# Patient Record
Sex: Female | Born: 1995 | Race: Black or African American | Hispanic: No | Marital: Single | Smoking: Never smoker
Health system: Southern US, Community
[De-identification: ages and names within clinical notes are randomized; demographics above are authoritative.]

## PROBLEM LIST (undated history)

## (undated) DIAGNOSIS — F988 Other specified behavioral and emotional disorders with onset usually occurring in childhood and adolescence: Secondary | ICD-10-CM

## (undated) DIAGNOSIS — D649 Anemia, unspecified: Secondary | ICD-10-CM

---

## 2019-07-31 ENCOUNTER — Encounter (HOSPITAL_COMMUNITY): Payer: Self-pay

## 2019-07-31 ENCOUNTER — Other Ambulatory Visit: Payer: Self-pay

## 2019-07-31 ENCOUNTER — Ambulatory Visit (HOSPITAL_COMMUNITY)
Admission: EM | Admit: 2019-07-31 | Discharge: 2019-07-31 | Disposition: A | Discharge status: Home / Self Care | Payer: PRIVATE HEALTH INSURANCE

## 2019-07-31 DIAGNOSIS — S0990XA Unspecified injury of head, initial encounter: Secondary | ICD-10-CM

## 2019-07-31 NOTE — ED Triage Notes (Signed)
Pt states she tripped and fell yesterday and hit her frontal head onto corner of wall. Pt denies LOC. PT states she saw a "bright flash" when she hit her head. Pt woke up today dizzy and nauseated. Pt states she spit up. Pt denies blood thinners. Pt walked well to exam room. Pt is A&Ox4. Pt is able to move all extremities.

## 2019-07-31 NOTE — ED Provider Notes (Signed)
[place]    CSN: [id number] Arrival date & time: 07/31/19  1217      History   Chief Complaint Chief Complaint  Patient presents with  . Fall    HPI Melody Colville is a 24 y.o. female.   Patient is a 24 year old female presents today for fall and head injury.  Reporting yesterday she fell hit her frontal head onto the corner of a wall.  Small abrasion swelling.  Denies any loss of consciousness after the head injury.  Had mild headache afterwards and went to bed.  Woke up mildly dizzy, nauseous was still having about a 4 out of 10 headache.  Denies being on any blood thinners.  Denies any vision changes.  She has not taken any medication for symptoms.  Denies any numbness, tingling or weakness in extremities.  She has had some mild photophobia.  ROS per HPI      History reviewed. No pertinent past medical history.  There are no problems to display for this patient.   History reviewed. No pertinent surgical history.  OB History   No obstetric history on file.      Home Medications    Prior to Admission medications   Not on File    Family History Family History  Problem Relation Age of Onset  . Asthma Mother   . Cancer Father     Social History Social History   Tobacco Use  . Smoking status: Never Smoker  . Smokeless tobacco: Never Used  Substance Use Topics  . Alcohol use: Yes    Comment: occ  . Drug use: Never     Allergies   Patient has no known allergies.   Review of Systems Review of Systems   Physical Exam Triage Vital Signs ED Triage Vitals [07/31/19 1252]  Enc Vitals Group     BP 122/70     Pulse Rate 91     Resp 16     Temp 98.5 F (36.9 C)     Temp Source Oral     SpO2 98 %     Weight 155 lb (70.3 kg)     Height 5\' 5"  (1.651 m)     Head Circumference      Peak Flow      Pain Score 4     Pain Loc      Pain Edu?      Excl. in [place]?    No data found.  Updated Vital Signs BP 122/70   Pulse 91   Temp 98.5  F (36.9 C) (Oral)   Resp 16   Ht 5\' 5"  (1.651 m)   Wt 155 lb (70.3 kg)   SpO2 98%   BMI 25.79 kg/m   Visual Acuity Right Eye Distance:   Left Eye Distance:   Bilateral Distance:    Right Eye Near:   Left Eye Near:    Bilateral Near:     Physical Exam Vitals and nursing note reviewed.  Constitutional:      General: She is not in acute distress.    Appearance: Normal appearance. She is not ill-appearing, toxic-appearing or diaphoretic.  HENT:     Head: Normocephalic.     Nose: Nose normal.  Eyes:     Extraocular Movements: Extraocular movements intact.     Conjunctiva/sclera: Conjunctivae normal.     Pupils: Pupils are equal, round, and reactive to light.  Pulmonary:     Effort: Pulmonary effort is normal.  Musculoskeletal:  General: Normal range of motion.     Cervical back: Normal range of motion.  Skin:    General: Skin is warm and dry.     Findings: No rash.  Neurological:     General: No focal deficit present.     Mental Status: She is alert.     Cranial Nerves: No cranial nerve deficit.     Sensory: No sensory deficit.     Motor: No weakness.     Coordination: Coordination normal.     Gait: Gait normal.     Comments: Cranial nerves grossly intact. Strength 5 out of 5 in all extremities Normal speech and gait   Psychiatric:        Mood and Affect: Mood normal.      UC Treatments / Results  Labs (all labs ordered are listed, but only abnormal results are displayed) Labs Reviewed - No data to display  EKG   Radiology No results found.  Procedures Procedures (including critical care time)  Medications Ordered in UC Medications - No data to display  Initial Impression / Assessment and Plan / UC Course  I have reviewed the triage vital signs and the nursing notes.  Pertinent labs & imaging results that were available during my care of the patient were reviewed by me and considered in my medical decision making (see chart for  details).     Head injury-neuro exam normal.  Patient does have mild forehead swelling with mild abrasion centered. She may have mild concussion.  We will have her decrease screen time. She is not currently having any nausea. Tylenol as needed for headache and rest Work note given to rest ER and return precautions given Final Clinical Impressions(s) / UC Diagnoses   Final diagnoses:  Injury of head, initial encounter     Discharge Instructions     You may have a mild concussion Decrease screen time Tylenol for headache.  Rest  Follow up as needed for continued or worsening symptoms     ED Prescriptions    None     PDMP not reviewed this encounter.   Janace Aris, NP 07/31/19 1329

## 2019-07-31 NOTE — Discharge Instructions (Signed)
You may have a mild concussion Decrease screen time Tylenol for headache.  Rest  Follow up as needed for continued or worsening symptoms

## 2020-01-20 ENCOUNTER — Other Ambulatory Visit: Payer: Self-pay

## 2020-01-20 ENCOUNTER — Emergency Department (HOSPITAL_BASED_OUTPATIENT_CLINIC_OR_DEPARTMENT_OTHER): Payer: No Typology Code available for payment source

## 2020-01-20 ENCOUNTER — Encounter (HOSPITAL_BASED_OUTPATIENT_CLINIC_OR_DEPARTMENT_OTHER): Payer: Self-pay

## 2020-01-20 ENCOUNTER — Emergency Department (HOSPITAL_BASED_OUTPATIENT_CLINIC_OR_DEPARTMENT_OTHER)
Admission: EM | Admit: 2020-01-20 | Discharge: 2020-01-20 | Disposition: A | Discharge status: Home / Self Care | Payer: No Typology Code available for payment source | Attending: Emergency Medicine | Admitting: Emergency Medicine

## 2020-01-20 DIAGNOSIS — S0990XA Unspecified injury of head, initial encounter: Secondary | ICD-10-CM | POA: Diagnosis present

## 2020-01-20 DIAGNOSIS — Y939 Activity, unspecified: Secondary | ICD-10-CM | POA: Diagnosis not present

## 2020-01-20 DIAGNOSIS — S060X0A Concussion without loss of consciousness, initial encounter: Secondary | ICD-10-CM | POA: Diagnosis not present

## 2020-01-20 DIAGNOSIS — Z79899 Other long term (current) drug therapy: Secondary | ICD-10-CM | POA: Diagnosis not present

## 2020-01-20 DIAGNOSIS — Y999 Unspecified external cause status: Secondary | ICD-10-CM | POA: Diagnosis not present

## 2020-01-20 DIAGNOSIS — Y92410 Unspecified street and highway as the place of occurrence of the external cause: Secondary | ICD-10-CM | POA: Insufficient documentation

## 2020-01-20 DIAGNOSIS — R519 Headache, unspecified: Secondary | ICD-10-CM

## 2020-01-20 MED ORDER — ONDANSETRON 4 MG PO TBDP
4.0000 mg | ORAL_TABLET | Freq: Once | ORAL | Status: AC
  Administered 2020-01-20: 4 mg via ORAL
  Filled 2020-01-20: qty 1

## 2020-01-20 MED ORDER — ACETAMINOPHEN 500 MG PO TABS
1000.0000 mg | ORAL_TABLET | Freq: Once | ORAL | Status: AC
  Administered 2020-01-20: 1000 mg via ORAL
  Filled 2020-01-20: qty 2

## 2020-01-20 MED ORDER — ONDANSETRON 4 MG PO TBDP: 4.0000 mg | ORAL_TABLET | Freq: Three times a day (TID) | ORAL | 0 refills | Status: DC | PRN
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
## 2020-01-20 NOTE — Discharge Instructions (Addendum)
You can take 600 mg of ibuprofen every 6 hours, you can take 1000 mg of Tylenol every 6 hours, you can alternate these every 3 or you can take them together.  
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
## 2020-06-08 ENCOUNTER — Other Ambulatory Visit: Payer: Self-pay

## 2020-06-08 DIAGNOSIS — K645 Perianal venous thrombosis: Secondary | ICD-10-CM | POA: Diagnosis not present

## 2020-06-08 DIAGNOSIS — R040 Epistaxis: Secondary | ICD-10-CM | POA: Diagnosis not present

## 2020-06-09 ENCOUNTER — Emergency Department (HOSPITAL_BASED_OUTPATIENT_CLINIC_OR_DEPARTMENT_OTHER)
Admission: EM | Admit: 2020-06-09 | Discharge: 2020-06-09 | Disposition: A | Discharge status: Home / Self Care | Payer: Medicaid - Out of State | Attending: Emergency Medicine | Admitting: Emergency Medicine

## 2020-06-09 ENCOUNTER — Other Ambulatory Visit: Payer: Self-pay

## 2020-06-09 ENCOUNTER — Encounter (HOSPITAL_BASED_OUTPATIENT_CLINIC_OR_DEPARTMENT_OTHER): Payer: Self-pay | Admitting: *Deleted

## 2020-06-09 DIAGNOSIS — R040 Epistaxis: Secondary | ICD-10-CM

## 2020-06-09 DIAGNOSIS — K645 Perianal venous thrombosis: Secondary | ICD-10-CM

## 2020-06-09 HISTORY — DX: Other specified behavioral and emotional disorders with onset usually occurring in childhood and adolescence: F98.8

## 2020-06-09 MED ORDER — OXYMETAZOLINE HCL 0.05 % NA SOLN
2.0000 | NASAL | Status: DC | PRN
  Administered 2020-06-09: 2 via NASAL
  Filled 2020-06-09: qty 30

## 2020-06-09 MED ORDER — LIDOCAINE 5 % EX CREA: TOPICAL_CREAM | CUTANEOUS | Status: DC

## 2020-06-09 NOTE — ED Triage Notes (Signed)
C/o nosebleed x 2 days and rectal bleeding x 1 day reports " mass at rectum"

## 2020-06-09 NOTE — ED Provider Notes (Signed)
[facility] Provider Note: Lowella Dell, MD, FACEP  CSN: [id number] MRN: [id number] ARRIVAL: 06/08/20 at 2352 ROOM: MH10/[facility]10   CHIEF COMPLAINT  Epistaxis and Rectal Bleeding   HISTORY OF PRESENT ILLNESS  06/09/20 1:06 AM Teresa Middleton is a 25 y.o. female who had the sudden onset of bilateral epistaxis yesterday.  Bleeding was moderate.  There was no trauma or inciting event.  The bleeding lasted only over 20 minutes but resolved on its own with pressure.  She is not having any bleeding now.  She never used any medications.  She also noticed a painful mass in her rectum yesterday.  She did not have any abnormally hard or strenuous bowel movements.  There is some bleeding associated with this mass.  She rates associated pain is a 6 out of 10, worse with defecation.   Past Medical History:  Diagnosis Date  . ADD (attention deficit disorder)     History reviewed. No pertinent surgical history.  Family History  Problem Relation Age of Onset  . Asthma Mother   . Cancer Father     Social History   Tobacco Use  . Smoking status: Never Smoker  . Smokeless tobacco: Never Used  Vaping Use  . Vaping Use: Never used  Substance Use Topics  . Alcohol use: Yes    Comment: occ  . Drug use: Never    Prior to Admission medications   Medication Sig Start Date End Date Taking? Authorizing Provider  Lidocaine 5 % CREA Apply to thrombosed hemorrhoid as needed for pain. 06/09/20  Yes Neave Lenger, MD  amphetamine-dextroamphetamine (ADDERALL) 20 MG tablet Take 20 mg by mouth daily. 05/25/20   [provider]  ondansetron (ZOFRAN ODT) 4 MG disintegrating tablet Take 1 tablet (4 mg total) by mouth every 8 (eight) hours as needed for up to 10 doses for nausea or vomiting. 01/20/20   Sabino Donovan, MD    Allergies Patient has no known allergies.   REVIEW OF SYSTEMS  Negative except as noted here or in the History of Present Illness.   PHYSICAL EXAMINATION  Initial  Vital Signs Blood pressure 112/75, pulse 79, temperature 98.1 F (36.7 C), temperature source Oral, resp. rate 18, height 5\' 5"  (1.651 m), weight 63.5 kg, last menstrual period 06/08/2020, SpO2 98 %.  Examination General: Well-developed, well-nourished female in no acute distress; appearance consistent with age of record HENT: normocephalic; atraumatic; slight amount of dried blood in nares; pallor of turbinates Eyes: pupils equal, round and reactive to light; extraocular muscles intact Neck: supple Heart: regular rate and rhythm Lungs: clear to auscultation bilaterally Abdomen: soft; nondistended; nontender; bowel sounds present Rectal: Tender, thrombosed hemorrhoid at 3:00 in the lithotomy position Extremities: No deformity; full range of motion; pulses normal Neurologic: Awake, alert and oriented; motor function intact in all extremities and symmetric; no facial droop Skin: Warm and dry Psychiatric: Normal mood and affect   RESULTS  Summary of this visit's results, reviewed and interpreted by myself:   EKG Interpretation  Date/Time:    Ventricular Rate:    PR Interval:    QRS Duration:   QT Interval:    QTC Calculation:   R Axis:     Text Interpretation:        Laboratory Studies: No results found for this or any previous visit (from the past 24 hour(s)). Imaging Studies: No results found.  ED COURSE and MDM  Nursing notes, initial and subsequent vitals signs, including pulse oximetry, reviewed and interpreted  by myself.  Vitals:   06/09/20 0003 06/09/20 0004  BP:  112/75  Pulse:  79  Resp:  18  Temp:  98.1 F (36.7 C)  TempSrc:  Oral  SpO2:  98%  Weight: 63.5 kg   Height: 5\' 5"  (1.651 m)    Medications  oxymetazoline (AFRIN) 0.05 % nasal spray 2 spray (has no administration in time range)    We will provide Afrin for treatment of epistaxis should it recur.  Patient declined surgical treatment of thrombosed hemorrhoid after discussion of risks and  benefits.  She was advised she may return at any time should she change her mind regarding treatment.  PROCEDURES  Procedures   ED DIAGNOSES     ICD-10-CM   1. Epistaxis  R04.0   2. Thrombosed external hemorrhoid  K64.5        Kelcey Wickstrom, , MD 06/09/20 [phone]

## 2020-06-13 ENCOUNTER — Observation Stay (HOSPITAL_COMMUNITY)
Admission: EM | Admit: 2020-06-13 | Discharge: 2020-06-14 | Disposition: A | Discharge status: Home / Self Care | Payer: Medicaid - Out of State | Attending: Family Medicine | Admitting: Family Medicine

## 2020-06-13 ENCOUNTER — Encounter (HOSPITAL_COMMUNITY): Payer: Self-pay | Admitting: Emergency Medicine

## 2020-06-13 ENCOUNTER — Other Ambulatory Visit: Payer: Self-pay

## 2020-06-13 DIAGNOSIS — D649 Anemia, unspecified: Secondary | ICD-10-CM

## 2020-06-13 DIAGNOSIS — D509 Iron deficiency anemia, unspecified: Secondary | ICD-10-CM

## 2020-06-13 DIAGNOSIS — R040 Epistaxis: Principal | ICD-10-CM | POA: Insufficient documentation

## 2020-06-13 DIAGNOSIS — U071 COVID-19: Secondary | ICD-10-CM | POA: Insufficient documentation

## 2020-06-13 DIAGNOSIS — K649 Unspecified hemorrhoids: Secondary | ICD-10-CM

## 2020-06-13 HISTORY — DX: Anemia, unspecified: D64.9

## 2020-06-13 LAB — BASIC METABOLIC PANEL
Anion gap: 9 (ref 5–15)
BUN: 7 mg/dL (ref 6–20)
CO2: 23 mmol/L (ref 22–32)
Calcium: 9.3 mg/dL (ref 8.9–10.3)
Chloride: 108 mmol/L (ref 98–111)
Creatinine, Ser: 0.76 mg/dL (ref 0.44–1.00)
GFR, Estimated: >60 mL/min (ref >60)
Glucose, Bld: 96 mg/dL (ref 70–99)
Potassium: 4.4 mmol/L (ref 3.5–5.1)
Sodium: 140 mmol/L (ref 135–145)

## 2020-06-13 LAB — CBC WITH DIFFERENTIAL/PLATELET
Abs Immature Granulocytes: 0.01 10*3/uL (ref 0.00–0.07)
Basophils Absolute: 0 10*3/uL (ref 0.0–0.1)
Basophils Relative: 0 %
Eosinophils Absolute: 0 10*3/uL (ref 0.0–0.5)
Eosinophils Relative: 1 %
HCT: 23 % — ABNORMAL LOW (ref 36.0–46.0)
Hemoglobin: 5.8 g/dL — CL (ref 12.0–15.0)
Immature Granulocytes: 0 %
Lymphocytes Relative: 39 %
Lymphs Abs: 1.2 10*3/uL (ref 0.7–4.0)
MCH: 15 pg — ABNORMAL LOW (ref 26.0–34.0)
MCHC: 25.2 g/dL — ABNORMAL LOW (ref 30.0–36.0)
MCV: 59.4 fL — ABNORMAL LOW (ref 80.0–100.0)
Monocytes Absolute: 0.3 10*3/uL (ref 0.1–1.0)
Monocytes Relative: 10 %
Neutro Abs: 1.6 10*3/uL — ABNORMAL LOW (ref 1.7–7.7)
Neutrophils Relative %: 50 %
Platelets: 346 10*3/uL (ref 150–400)
RBC: 3.87 MIL/uL (ref 3.87–5.11)
RDW: 22.8 % — ABNORMAL HIGH (ref 11.5–15.5)
WBC: 3.1 10*3/uL — ABNORMAL LOW (ref 4.0–10.5)
nRBC: 0 % (ref 0.0–0.2)

## 2020-06-13 LAB — FERRITIN: Ferritin: 1 ng/mL — ABNORMAL LOW (ref 11–307)

## 2020-06-13 LAB — SARS CORONAVIRUS 2 BY RT PCR (HOSPITAL ORDER, PERFORMED IN ~~LOC~~ HOSPITAL LAB): SARS Coronavirus 2: POSITIVE — AB

## 2020-06-13 LAB — IRON AND TIBC
Iron: 12 ug/dL — ABNORMAL LOW (ref 28–170)
Saturation Ratios: 2 % — ABNORMAL LOW (ref 10.4–31.8)
TIBC: 487 ug/dL — ABNORMAL HIGH (ref 250–450)
UIBC: 475 ug/dL

## 2020-06-13 LAB — RETICULOCYTES
Immature Retic Fract: 16.4 % — ABNORMAL HIGH (ref 2.3–15.9)
RBC.: 3.85 MIL/uL — ABNORMAL LOW (ref 3.87–5.11)
Retic Count, Absolute: 46.6 10*3/uL (ref 19.0–186.0)
Retic Ct Pct: 1.2 % (ref 0.4–3.1)

## 2020-06-13 LAB — VITAMIN B12: Vitamin B-12: 330 pg/mL (ref 180–914)

## 2020-06-13 LAB — FOLATE: Folate: 10.6 ng/mL (ref >5.9)

## 2020-06-13 LAB — ABO/RH: ABO/RH(D): O POS

## 2020-06-13 LAB — PREPARE RBC (CROSSMATCH)

## 2020-06-13 MED ORDER — SODIUM CHLORIDE 0.9 % IV SOLN: 10.0000 mL/h | Freq: Once | INTRAVENOUS | Status: DC

## 2020-06-13 MED ORDER — ONDANSETRON HCL 4 MG/2ML IJ SOLN: 4.0000 mg | Freq: Four times a day (QID) | INTRAMUSCULAR | Status: DC | PRN

## 2020-06-13 MED ORDER — ACETAMINOPHEN 650 MG RE SUPP: 650.0000 mg | Freq: Four times a day (QID) | RECTAL | Status: DC | PRN

## 2020-06-13 MED ORDER — HYDROCORTISONE (PERIANAL) 2.5 % EX CREA: 1.0000 "application " | TOPICAL_CREAM | Freq: Two times a day (BID) | CUTANEOUS | 0 refills | Status: DC

## 2020-06-13 MED ORDER — SODIUM CHLORIDE 0.9 % IV SOLN
510.0000 mg | Freq: Once | INTRAVENOUS | Status: AC
  Administered 2020-06-14: 510 mg via INTRAVENOUS
  Filled 2020-06-13: qty 17

## 2020-06-13 MED ORDER — ONDANSETRON HCL 4 MG PO TABS
4.0000 mg | ORAL_TABLET | Freq: Four times a day (QID) | ORAL | Status: DC | PRN
  Filled 2020-06-13: qty 1

## 2020-06-13 MED ORDER — ACETAMINOPHEN 325 MG PO TABS
650.0000 mg | ORAL_TABLET | Freq: Four times a day (QID) | ORAL | Status: DC | PRN
  Administered 2020-06-14: 650 mg via ORAL
  Filled 2020-06-13: qty 2

## 2020-06-13 NOTE — ED Triage Notes (Signed)
Reports intermittent nosebleeds x 1 week.  States she was seen at [facility] and using Afrin but continues to have nosebleeds.  No bleeding at present.

## 2020-06-13 NOTE — ED Provider Notes (Signed)
[facility] EMERGENCY DEPARTMENT Provider Note   CSN: [id number] Arrival date & time: 06/13/20  1515     History Chief Complaint  Patient presents with  . Epistaxis    Teresa Middleton is a 25 y.o. female.  Patient with history of iron deficiency anemia presents with recurrent nosebleeds for the past week.  No injuries.  Patient is having gradually worsening fatigue and lightheadedness.  No syncope.  No blood thinner use.  Patient has humidifier at home to help.  Patient was seen recently for similar at outside hospital.        Past Medical History:  Diagnosis Date  . ADD (attention deficit disorder)   . Anemia     There are no problems to display for this patient.   History reviewed. No pertinent surgical history.   OB History   No obstetric history on file.     Family History  Problem Relation Age of Onset  . Asthma Mother   . Cancer Father     Social History   Tobacco Use  . Smoking status: Never Smoker  . Smokeless tobacco: Never Used  Vaping Use  . Vaping Use: Never used  Substance Use Topics  . Alcohol use: Yes    Comment: occ  . Drug use: Never    Home Medications Prior to Admission medications   Medication Sig Start Date End Date Taking? Authorizing Provider  hydrocortisone (ANUSOL-HC) 2.5 % rectal cream Place 1 application rectally 2 (two) times daily. 06/13/20  Yes Blane Ohara, MD  amphetamine-dextroamphetamine (ADDERALL) 20 MG tablet Take 20 mg by mouth daily. 05/25/20   [provider]  Lidocaine 5 % CREA Apply to thrombosed hemorrhoid as needed for pain. 06/09/20   Molpus, Jonny Ruiz, MD  ondansetron (ZOFRAN ODT) 4 MG disintegrating tablet Take 1 tablet (4 mg total) by mouth every 8 (eight) hours as needed for up to 10 doses for nausea or vomiting. 01/20/20   Sabino Donovan, MD    Allergies    Patient has no known allergies.  Review of Systems   Review of Systems  Constitutional: Negative for chills and fever.  HENT:  Positive for nosebleeds. Negative for congestion.   Eyes: Negative for visual disturbance.  Respiratory: Negative for shortness of breath.   Cardiovascular: Negative for chest pain.  Gastrointestinal: Negative for abdominal pain and vomiting.  Genitourinary: Negative for dysuria and flank pain.  Musculoskeletal: Negative for back pain, neck pain and neck stiffness.  Skin: Negative for rash.  Neurological: Positive for light-headedness. Negative for headaches.    Physical Exam Updated Vital Signs BP 128/85 (BP Location: Right Arm)   Pulse (!) 101   Temp 98.5 F (36.9 C)   Resp 16   LMP 06/08/2020   SpO2 99%   Physical Exam Vitals and nursing note reviewed.  Constitutional:      Appearance: She is well-developed and well-nourished.  HENT:     Head: Normocephalic and atraumatic.     Comments: Patient has prominent vessel dried blood right anterior septal region, no active bleeding, no hematoma. Eyes:     General:        Right eye: No discharge.        Left eye: No discharge.     Conjunctiva/sclera: Conjunctivae normal.  Neck:     Trachea: No tracheal deviation.  Cardiovascular:     Rate and Rhythm: Tachycardia present.  Pulmonary:     Effort: Pulmonary effort is normal.  Abdominal:  General: There is no distension.     Palpations: Abdomen is soft.  Musculoskeletal:        General: No edema.     Cervical back: Normal range of motion and neck supple.  Skin:    General: Skin is warm.     Coloration: Skin is pale.     Findings: No rash.  Neurological:     Mental Status: She is alert and oriented to person, place, and time.  Psychiatric:        Mood and Affect: Mood and affect and mood normal.     ED Results / Procedures / Treatments   Labs (all labs ordered are listed, but only abnormal results are displayed) Labs Reviewed  CBC WITH DIFFERENTIAL/PLATELET - Abnormal; Notable for the following components:      Result Value   WBC 3.1 (*)    Hemoglobin 5.8 (*)     HCT 23.0 (*)    MCV 59.4 (*)    MCH 15.0 (*)    MCHC 25.2 (*)    RDW 22.8 (*)    Neutro Abs 1.6 (*)    All other components within normal limits  RETICULOCYTES - Abnormal; Notable for the following components:   RBC. 3.85 (*)    Immature Retic Fract 16.4 (*)    All other components within normal limits  SARS CORONAVIRUS 2 BY RT PCR (HOSPITAL ORDER, PERFORMED IN [facility])  BASIC METABOLIC PANEL  VITAMIN B12  FOLATE  IRON AND TIBC  FERRITIN  TYPE AND SCREEN  PREPARE RBC (CROSSMATCH)    EKG None  Radiology No results found.  Procedures .Epistaxis Management  Date/Time: 06/13/2020 5:53 PM Performed by: Blane Ohara, MD Authorized by: Blane Ohara, MD   Consent:    Consent obtained:  Verbal   Consent given by:  Patient   Risks, benefits, and alternatives were discussed: yes     Risks discussed:  Bleeding   Alternatives discussed:  No treatment Universal protocol:    Procedure explained and questions answered to patient or proxy's satisfaction: yes     Patient identity confirmed:  Verbally with patient Anesthesia:    Anesthesia method:  None Procedure details:    Treatment site:  R anterior   Treatment method:  Silver nitrate   Treatment complexity:  Limited   Treatment episode: initial   Post-procedure details:    Assessment:  Bleeding stopped   Procedure completion:  Tolerated  .Critical Care Performed by: Blane Ohara, MD Authorized by: Blane Ohara, MD   Critical care provider statement:    Critical care time (minutes):  40   Critical care start time:  06/13/2020 6:00 PM   Critical care end time:  06/13/2020 6:40 PM   Critical care time was exclusive of:  Separately billable procedures and treating other patients and teaching time   Critical care was time spent personally by me on the following activities:  Evaluation of patient's response to treatment, examination of patient, ordering and performing treatments and interventions,  ordering and review of laboratory studies, pulse oximetry, re-evaluation of patient's condition and obtaining history from patient or surrogate   Care discussed with: admitting provider   Comments:     Symptomatic anemia, PRBCs     Medications Ordered in ED Medications  0.9 %  sodium chloride infusion (has no administration in time range)    ED Course  I have reviewed the triage vital signs and the nursing notes.  Pertinent labs & imaging results that were  available during my care of the patient were reviewed by me and considered in my medical decision making (see chart for details).    MDM Rules/Calculators/A&P                          Patient presents with recurrent epistaxis and symptoms of fatigue and lightheadedness. Silver nitrate utilized on right nare. Likely secondary to iron deficiency anemia with fatigue and patient not taking iron currently however with intermittent nosebleeds plan to check platelets and hemoglobin as well.  Patient recently seen for hemorrhoids and plan for topical steroids and outpatient follow-up for this.  Blood work reviewed showing hemoglobin 5.8, microcytic, normal platelets, white blood cell count 3.1.  Patient has seen hematology in the past when she was younger for this.  Concern for chronic iron deficiency anemia with acute epistaxis this week.  Patient not actively bleeding at this time.  Type and screen, anemia panel and paged unassigned.  Discussed with hospitalist plan for observation, discussed risks and benefits of blood transfusion patient accepts.  1 unit of packed red blood cells ordered.  Anemia panel pending.   Final Clinical Impression(s) / ED Diagnoses Final diagnoses:  Right-sided epistaxis  Symptomatic anemia  Acute hemorrhoid    Rx / DC Orders ED Discharge Orders         Ordered    hydrocortisone (ANUSOL-HC) 2.5 % rectal cream  2 times daily        06/13/20 1747           Blane Ohara, MD 06/13/20 1919

## 2020-06-13 NOTE — ED Notes (Signed)
Date and time results received: 06/13/20 now  (use smartphrase ".now" to insert current time)  Test: hgb Critical Value: 5.8  Name of Provider Notified: [email or web address]  Orders Received? Or Actions Taken?: none

## 2020-06-13 NOTE — ED Notes (Signed)
Report given to 5N 

## 2020-06-13 NOTE — H&P (Signed)
History and Physical    Teresa Middleton OZD:[id number] DOB: [birth year] DOA: 06/13/2020  PCP: Bridget Hartshorn, DO  Patient coming from: Home  I have personally briefly reviewed patient's old medical records in [facility]  Chief Complaint: Lightheadedness  HPI: Teresa Middleton is a 25 y.o. female with medical history significant for anemia who presents to the ED for evaluation of lightheadedness with recent epistaxis.  Patient reports having a diagnosis of anemia as a child.  She was placed on iron supplement at that time but did not have regular medical follow-up.  Over the last week she had several episodes of minor epistaxis.  She was initially seen in med [facility] ED on 06/09/2020.  She was given Afrin on discharge, labs were not obtained at that time.  She has been having gradually worsening fatigue and today patient developed lightheadedness without syncope.  She therefore presented to the ED for further evaluation and management.  Patient otherwise denies any hemoptysis, hematemesis, hematuria, hematochezia, melena.  She reports regular menses without significant increased bleeding.  ED Course:  Initial vitals showed BP 120/85, pulse 101, RR 16, temp 98.5 F, SPO2 99% on room air.  Labs significant for hemoglobin 5.8, hematocrit 23.0, MCV 59.4, RDW 22.8, platelets 346,000, WBC 3.1.  Sodium 140, potassium 4.4, bicarb 23, BUN 7, creatinine 0.76, serum glucose 96.  Anemia panel pending.  SARS-CoV-2 PCR test is ordered and pending.  Silver nitrate was applied to the right naris by EDP.  1 unit PRBC transfusion was ordered and the hospitalist service was consulted to admit for further evaluation and management.  Review of Systems: All systems reviewed and are negative except as documented in history of present illness above.   Past Medical History:  Diagnosis Date  . ADD (attention deficit disorder)   . Anemia     History reviewed. No pertinent surgical  history.  Social History:  reports that she has never smoked. She has never used smokeless tobacco. She reports current alcohol use. She reports that she does not use drugs.  No Known Allergies  Family History  Problem Relation Age of Onset  . Asthma Mother   . Cancer Father      Prior to Admission medications   Medication Sig Start Date End Date Taking? Authorizing Provider  hydrocortisone (ANUSOL-HC) 2.5 % rectal cream Place 1 application rectally 2 (two) times daily. 06/13/20  Yes Blane Ohara, MD  amphetamine-dextroamphetamine (ADDERALL) 20 MG tablet Take 20 mg by mouth daily. 05/25/20   [provider]  Lidocaine 5 % CREA Apply to thrombosed hemorrhoid as needed for pain. 06/09/20   Molpus, Jonny Ruiz, MD  ondansetron (ZOFRAN ODT) 4 MG disintegrating tablet Take 1 tablet (4 mg total) by mouth every 8 (eight) hours as needed for up to 10 doses for nausea or vomiting. Patient not taking: Reported on 06/13/2020 01/20/20   Sabino Donovan, MD    Physical Exam: Vitals:   06/13/20 1529  BP: 128/85  Pulse: (!) 101  Resp: 16  Temp: 98.5 F (36.9 C)  SpO2: 99%   Constitutional: NAD, calm, comfortable Eyes: PERRL, lids and conjunctivae normal ENMT: Mucous membranes are moist. Posterior pharynx clear of any exudate or lesions.Normal dentition.  Septal nose ring. Neck: normal, supple, no masses. Respiratory: clear to auscultation bilaterally, no wheezing, no crackles. Normal respiratory effort. No accessory muscle use.  Cardiovascular: Regular rate and rhythm, no murmurs / rubs / gallops. No extremity edema. 2+ pedal pulses. Abdomen: no tenderness, no masses  palpated. No hepatosplenomegaly. Bowel sounds positive.  Musculoskeletal: no clubbing / cyanosis. No joint deformity upper and lower extremities. Good ROM, no contractures. Normal muscle tone.  Skin: no rashes, lesions, ulcers. No induration Neurologic: CN 2-12 grossly intact. Sensation intact. Strength 5/5 in all 4.  Psychiatric:  Normal judgment and insight. Alert and oriented x 3. Normal mood.     Labs on Admission: I have personally reviewed following labs and imaging studies  CBC: Recent Labs  Lab 06/13/20 1720  WBC 3.1*  NEUTROABS 1.6*  HGB 5.8*  HCT 23.0*  MCV 59.4*  PLT 346   Basic Metabolic Panel: Recent Labs  Lab 06/13/20 1812  NA 140  K 4.4  CL 108  CO2 23  GLUCOSE 96  BUN 7  CREATININE 0.76  CALCIUM 9.3   GFR: Estimated Creatinine Clearance: 98.4 mL/min (by C-G formula based on SCr of 0.76 mg/dL). Liver Function Tests: No results for input(s): AST, ALT, ALKPHOS, BILITOT, PROT, ALBUMIN in the last 168 hours. No results for input(s): LIPASE, AMYLASE in the last 168 hours. No results for input(s): AMMONIA in the last 168 hours. Coagulation Profile: No results for input(s): INR, PROTIME in the last 168 hours. Cardiac Enzymes: No results for input(s): CKTOTAL, CKMB, CKMBINDEX, TROPONINI in the last 168 hours. BNP (last 3 results) No results for input(s): PROBNP in the last 8760 hours. HbA1C: No results for input(s): HGBA1C in the last 72 hours. CBG: No results for input(s): GLUCAP in the last 168 hours. Lipid Profile: No results for input(s): CHOL, HDL, LDLCALC, TRIG, CHOLHDL, LDLDIRECT in the last 72 hours. Thyroid Function Tests: No results for input(s): TSH, T4TOTAL, FREET4, T3FREE, THYROIDAB in the last 72 hours. Anemia Panel: Recent Labs    06/13/20 1812  RETICCTPCT 1.2   Urine analysis: No results found for: COLORURINE, APPEARANCEUR, LABSPEC, PHURINE, GLUCOSEU, HGBUR, BILIRUBINUR, KETONESUR, PROTEINUR, UROBILINOGEN, NITRITE, LEUKOCYTESUR  Radiological Exams on Admission: No results found.  EKG: Personally reviewed.  Not performed.  Assessment/Plan Principal Problem:   Symptomatic anemia  Teresa Middleton is a 25 y.o. female with medical history significant for anemia who is admitted with symptomatic anemia.  Symptomatic anemia: Suspect iron deficiency anemia,  likely chronic.  Degree of epistaxis reported unlikely to result in significant acute blood loss anemia. -Transfusing 1 unit PRBC -Follow anemia panel, will consider IV iron transfusion in a.m. if labs do show iron deficiency  Epistaxis: Reports several episodes of minor nosebleed with last occurrence 2 days ago.  Silver nitrate utilized on the right naris in the ED.  Follow-up with ENT if having recurrent epistaxis.  DVT prophylaxis: SCDs Code Status: Full code Family Communication: Discussed with patient Disposition Plan: From home and likely discharged home Consults called: None Level of care: Med-Surg Admission status:  Status is: Observation  The patient remains OBS appropriate and will d/c before 2 midnights.  Dispo: The patient is from: Home              Anticipated d/c is to: Home              Anticipated d/c date is: 1 day              Patient currently is not medically stable to d/c.   Difficult to place patient No    Darreld Mclean MD [facility]  If 7PM-7AM, please contact night-coverage [email or web address]  06/13/2020, 7:36 PM

## 2020-06-13 NOTE — ED Notes (Signed)
Date and time results received: 06/13/20 8:47 PM   (use smartphrase ".now" to insert current time)  Test: covid Critical Value: positive  Name of Provider Notified: zavitz  Orders Received? Or Actions Taken?: Actions Taken: none

## 2020-06-14 DIAGNOSIS — D649 Anemia, unspecified: Secondary | ICD-10-CM

## 2020-06-14 DIAGNOSIS — D509 Iron deficiency anemia, unspecified: Secondary | ICD-10-CM

## 2020-06-14 DIAGNOSIS — R040 Epistaxis: Secondary | ICD-10-CM

## 2020-06-14 DIAGNOSIS — U071 COVID-19: Secondary | ICD-10-CM

## 2020-06-14 LAB — CBC
HCT: 26.9 % — ABNORMAL LOW (ref 36.0–46.0)
Hemoglobin: 7.3 g/dL — ABNORMAL LOW (ref 12.0–15.0)
MCH: 17.2 pg — ABNORMAL LOW (ref 26.0–34.0)
MCHC: 27.1 g/dL — ABNORMAL LOW (ref 30.0–36.0)
MCV: 63.4 fL — ABNORMAL LOW (ref 80.0–100.0)
Platelets: 319 10*3/uL (ref 150–400)
RBC: 4.24 MIL/uL (ref 3.87–5.11)
RDW: 26.5 % — ABNORMAL HIGH (ref 11.5–15.5)
WBC: 4.4 10*3/uL (ref 4.0–10.5)
nRBC: 0 % (ref 0.0–0.2)

## 2020-06-14 LAB — BPAM RBC
Blood Product Expiration Date: 202203082359
ISSUE DATE / TIME: 202202062005
Unit Type and Rh: 5100

## 2020-06-14 LAB — HIV ANTIBODY (ROUTINE TESTING W REFLEX): HIV Screen 4th Generation wRfx: NONREACTIVE

## 2020-06-14 LAB — TYPE AND SCREEN
ABO/RH(D): O POS
Antibody Screen: NEGATIVE
Unit division: 0

## 2020-06-14 LAB — HEMOGLOBIN AND HEMATOCRIT, BLOOD
HCT: 26.7 % — ABNORMAL LOW (ref 36.0–46.0)
HCT: 27.1 % — ABNORMAL LOW (ref 36.0–46.0)
Hemoglobin: 7.3 g/dL — ABNORMAL LOW (ref 12.0–15.0)
Hemoglobin: 7.4 g/dL — ABNORMAL LOW (ref 12.0–15.0)

## 2020-06-14 NOTE — Discharge Summary (Signed)
Physician Discharge Summary  Teresa Middleton KGY:[id number] DOB: 19-Mar-1996 DOA: 06/13/2020  PCP: Bridget Hartshorn, DO  Admit date: 06/13/2020 Discharge date: 06/14/2020  Admitted From: Home Disposition: Home  Recommendations for Outpatient Follow-up:  1. Follow up with PCP in 1 week 2. Please obtain BMP/CBC in one week 3. Please follow up on the following pending results: None  Home Health: None Equipment/Devices: None  Discharge Condition: Stable CODE STATUS: Full code Diet recommendation: Regular diet   Brief/Interim Summary:  Admission HPI written by Charlsie Quest, MD   Chief Complaint: Lightheadedness  HPI: Teresa Middleton is a 25 y.o. female with medical history significant for anemia who presents to the ED for evaluation of lightheadedness with recent epistaxis.  Patient reports having a diagnosis of anemia as a child.  She was placed on iron supplement at that time but did not have regular medical follow-up.  Over the last week she had several episodes of minor epistaxis.  She was initially seen in med [facility] ED on 06/09/2020.  She was given Afrin on discharge, labs were not obtained at that time.  She has been having gradually worsening fatigue and today patient developed lightheadedness without syncope.  She therefore presented to the ED for further evaluation and management.  Patient otherwise denies any hemoptysis, hematemesis, hematuria, hematochezia, melena.  She reports regular menses without significant increased bleeding.   Hospital course:  Symptomatic anemia Hemoglobin of 5.8 on admission. Patient received 1 unit of PRBC with improvement of hemoglobin to 7.3 and remained stable. Asymptomatic.  Iron deficiency anemia Unsure of etiology. Patient has a history of severe anemia and was previously recommended to follow-up with a hematologist but never did. Given IV iron prior to discharge. Recommend hematology follow-up. Pathologist smear review  ordered prior to discharge.  Epistaxis Right nare. Treated with silver nitrate. Resolved.  COVID-19 infection Previously infected about one month prior. Asymptomatic. Does not require further isolation.  Discharge Diagnoses:  Principal Problem:   Symptomatic anemia Active Problems:   Epistaxis   COVID-19 virus infection   Iron deficiency anemia    Discharge Instructions   Allergies as of 06/14/2020   No Known Allergies     Medication List    STOP taking these medications   oxymetazoline 0.05 % nasal spray Commonly known as: AFRIN     TAKE these medications   amphetamine-dextroamphetamine 20 MG tablet Commonly known as: ADDERALL Take 20 mg by mouth daily.   hydrocortisone 2.5 % rectal cream Commonly known as: ANUSOL-HC Place 1 application rectally 2 (two) times daily.   Lidocaine 5 % Crea Apply to thrombosed hemorrhoid as needed for pain.   ondansetron 4 MG disintegrating tablet Commonly known as: Zofran ODT Take 1 tablet (4 mg total) by mouth every 8 (eight) hours as needed for up to 10 doses for nausea or vomiting.       No Known Allergies  Consultations:  None   Procedures/Studies:  No results found.    Subjective: Feels better today.  Discharge Exam: Vitals:   06/14/20 0441 06/14/20 0453  BP: 116/66 109/69  Pulse: 70 70  Resp: 18 16  Temp:    SpO2: 100% 100%   Vitals:   06/14/20 0441 06/14/20 0453 06/14/20 1209 06/14/20 1259  BP: 116/66 109/69 117/72   Pulse: 70 70 71   Resp: 18 16 18    Temp:   98.3 F (36.8 C)   TempSrc:   Oral   SpO2: 100% 100%  Weight:    61.7 kg  Height:    5\' 5"  (1.651 m)    General: Pt is alert, awake, not in acute distress Cardiovascular: RRR, S1/S2 +, no rubs, no gallops Respiratory: CTA bilaterally, no wheezing, no rhonchi Abdominal: Soft, NT, ND, bowel sounds + Extremities: no edema, no cyanosis    The results of significant diagnostics from this hospitalization (including imaging,  microbiology, ancillary and laboratory) are listed below for reference.     Microbiology: Recent Results (from the past 240 hour(s))  SARS Coronavirus 2 by RT PCR (hospital order, performed in [facility] hospital lab) Nasopharyngeal Nasopharyngeal Swab     Status: Abnormal   Collection Time: 06/13/20  7:34 PM   Specimen: Nasopharyngeal Swab  Result Value Ref Range Status   SARS Coronavirus 2 POSITIVE (A) NEGATIVE Final    Comment: RESULT CALLED TO, READ BACK BY AND VERIFIED WITH: RN KRISTEN MILLS BY MESSAN H. AT 2045 ON 06/13/2020 (NOTE) SARS-CoV-2 target nucleic acids are DETECTED  SARS-CoV-2 RNA is generally detectable in upper respiratory specimens  during the acute phase of infection.  Positive results are indicative  of the presence of the identified virus, but do not rule out bacterial infection or co-infection with other pathogens not detected by the test.  Clinical correlation with patient history and  other diagnostic information is necessary to determine patient infection status.  The expected result is negative.  Fact Sheet for Patients:   08/11/2020   Fact Sheet for Healthcare Providers:   BoilerBrush.com.cy    This test is not yet approved or cleared by the https://pope.com/ FDA and  has been authorized for detection and/or diagnosis of SARS-CoV-2 by FDA under an Emergency Use Authorization (EUA).  This EUA will remain in effect ( meaning this test can be used) for the duration of  the COVID-19 declaration under Section 564(b)(1) of the Act, 21 U.S.C. section 360-bbb-3(b)(1), unless the authorization is terminated or revoked sooner.  Performed at [facility], [address], [place], [place]      Labs: BNP (last 3 results) No results for input(s): BNP in the last 8760 hours. Basic Metabolic Panel: Recent Labs  Lab 06/13/20 1812  NA 140  K 4.4  CL 108  CO2 23  GLUCOSE 96  BUN 7   CREATININE 0.76  CALCIUM 9.3   Liver Function Tests: No results for input(s): AST, ALT, ALKPHOS, BILITOT, PROT, ALBUMIN in the last 168 hours. No results for input(s): LIPASE, AMYLASE in the last 168 hours. No results for input(s): AMMONIA in the last 168 hours. CBC: Recent Labs  Lab 06/13/20 1720 06/14/20 0342 06/14/20 0803 06/14/20 1444  WBC 3.1* 4.4  --   --   NEUTROABS 1.6*  --   --   --   HGB 5.8* 7.3* 7.3* 7.4*  HCT 23.0* 26.9* 26.7* 27.1*  MCV 59.4* 63.4*  --   --   PLT 346 319  --   --    Anemia work up Recent Labs    06/13/20 1812  VITAMINB12 330  FOLATE 10.6  FERRITIN 1*  TIBC 487*  IRON 12*  RETICCTPCT 1.2   Microbiology Recent Results (from the past 240 hour(s))  SARS Coronavirus 2 by RT PCR (hospital order, performed in [facility] hospital lab) Nasopharyngeal Nasopharyngeal Swab     Status: Abnormal   Collection Time: 06/13/20  7:34 PM   Specimen: Nasopharyngeal Swab  Result Value Ref Range Status   SARS Coronavirus 2 POSITIVE (  A) NEGATIVE Final    Comment: RESULT CALLED TO, READ BACK BY AND VERIFIED WITH: RN KRISTEN MILLS BY MESSAN H. AT 2045 ON 06/13/2020 (NOTE) SARS-CoV-2 target nucleic acids are DETECTED  SARS-CoV-2 RNA is generally detectable in upper respiratory specimens  during the acute phase of infection.  Positive results are indicative  of the presence of the identified virus, but do not rule out bacterial infection or co-infection with other pathogens not detected by the test.  Clinical correlation with patient history and  other diagnostic information is necessary to determine patient infection status.  The expected result is negative.  Fact Sheet for Patients:   BoilerBrush.com.cy   Fact Sheet for Healthcare Providers:   https://pope.com/    This test is not yet approved or cleared by the [place] FDA and  has been authorized for detection and/or diagnosis of SARS-CoV-2 by FDA  under an Emergency Use Authorization (EUA).  This EUA will remain in effect ( meaning this test can be used) for the duration of  the COVID-19 declaration under Section 564(b)(1) of the Act, 21 U.S.C. section 360-bbb-3(b)(1), unless the authorization is terminated or revoked sooner.  Performed at [facility], [address] [place], [place] [address]     SIGNED:   Jacquelin Hawking, MD [facility] 06/14/2020, 6:59 AM

## 2020-06-14 NOTE — Plan of Care (Signed)

## 2020-06-14 NOTE — Discharge Instructions (Signed)
Teresa Middleton,  You were in the hospital with anemia. You were given a blood transfusion with improvement of your hemoglobin. You were also given IV iron. Please follow-up with a hematologist as previously recommended. With regard to your nose, if you have recurrent nose bleeds, please hold pressure for 5 minutes.

## 2020-06-14 NOTE — Progress Notes (Signed)
Pt was given her AVS discharge summary and went over with her. Pt had no further questions. IV was removed with catheter intact. She will be waiting for her ride to pick her up. Will continue to monitor until she leaves.

## 2020-06-23 ENCOUNTER — Telehealth: Payer: Self-pay | Admitting: Hematology and Oncology

## 2020-06-23 NOTE — Telephone Encounter (Signed)
I received a call from Teresa Middleton to schedule a hem appt for severe anemia. Pt was seen in the hospital. She has been scheduled to see Dr. Bertis Ruddy on 2/22 at 11:15am. Pt aware to arrive 30 minutes early.

## 2020-06-29 ENCOUNTER — Inpatient Hospital Stay: Payer: Medicaid - Out of State

## 2020-06-29 ENCOUNTER — Inpatient Hospital Stay: Payer: Medicaid - Out of State | Attending: Hematology and Oncology | Admitting: Hematology and Oncology

## 2020-06-29 ENCOUNTER — Other Ambulatory Visit: Payer: Self-pay

## 2020-06-29 ENCOUNTER — Encounter: Payer: Self-pay | Admitting: Hematology and Oncology

## 2020-06-29 VITALS — BP 109/78 | HR 81 | Temp 98.4°F | Resp 12 | Ht 65.0 in | Wt 135.6 lb

## 2020-06-29 DIAGNOSIS — F988 Other specified behavioral and emotional disorders with onset usually occurring in childhood and adolescence: Secondary | ICD-10-CM | POA: Diagnosis not present

## 2020-06-29 DIAGNOSIS — D509 Iron deficiency anemia, unspecified: Secondary | ICD-10-CM

## 2020-06-29 DIAGNOSIS — K909 Intestinal malabsorption, unspecified: Secondary | ICD-10-CM | POA: Insufficient documentation

## 2020-06-29 DIAGNOSIS — D500 Iron deficiency anemia secondary to blood loss (chronic): Secondary | ICD-10-CM | POA: Diagnosis present

## 2020-06-29 DIAGNOSIS — Z79899 Other long term (current) drug therapy: Secondary | ICD-10-CM | POA: Insufficient documentation

## 2020-06-29 DIAGNOSIS — R040 Epistaxis: Secondary | ICD-10-CM | POA: Insufficient documentation

## 2020-06-29 LAB — CBC WITH DIFFERENTIAL/PLATELET
Abs Immature Granulocytes: 0.01 10*3/uL (ref 0.00–0.07)
Basophils Absolute: 0 10*3/uL (ref 0.0–0.1)
Basophils Relative: 1 %
Eosinophils Absolute: 0.1 10*3/uL (ref 0.0–0.5)
Eosinophils Relative: 2 %
HCT: 35 % — ABNORMAL LOW (ref 36.0–46.0)
Hemoglobin: 10 g/dL — ABNORMAL LOW (ref 12.0–15.0)
Immature Granulocytes: 0 %
Lymphocytes Relative: 28 %
Lymphs Abs: 1 10*3/uL (ref 0.7–4.0)
MCH: 20.4 pg — ABNORMAL LOW (ref 26.0–34.0)
MCHC: 28.6 g/dL — ABNORMAL LOW (ref 30.0–36.0)
MCV: 71.4 fL — ABNORMAL LOW (ref 80.0–100.0)
Monocytes Absolute: 0.3 10*3/uL (ref 0.1–1.0)
Monocytes Relative: 7 %
Neutro Abs: 2.2 10*3/uL (ref 1.7–7.7)
Neutrophils Relative %: 62 %
Platelets: 224 10*3/uL (ref 150–400)
RBC: 4.9 MIL/uL (ref 3.87–5.11)
WBC: 3.6 10*3/uL — ABNORMAL LOW (ref 4.0–10.5)
nRBC: 0 % (ref 0.0–0.2)

## 2020-06-29 LAB — IRON AND TIBC
Iron: 38 ug/dL — ABNORMAL LOW (ref 41–142)
Saturation Ratios: 10 % — ABNORMAL LOW (ref 21–57)
TIBC: 393 ug/dL (ref 236–444)
UIBC: 355 ug/dL (ref 120–384)

## 2020-06-29 LAB — SAMPLE TO BLOOD BANK

## 2020-06-29 LAB — FERRITIN: Ferritin: 26 ng/mL (ref 11–307)

## 2020-06-29 LAB — ABO/RH: ABO/RH(D): O POS

## 2020-06-29 NOTE — Progress Notes (Signed)
[facility] CONSULT NOTE  Patient Care Team: Bridget Hartshorn, DO as PCP - General  CHIEF COMPLAINTS/PURPOSE OF CONSULTATION:  Chronic, severe iron deficiency anemia  HISTORY OF PRESENTING ILLNESS:  Teresa Middleton 25 y.o. female is here because of recent findings of severe iron deficiency anemia The patient told me she has been anemic all her life In high school, she was found to have hemoglobin of four and was prescribed oral iron supplement without improvement Most recently, she had recurrent ER visits, initially for epistaxis and then subsequently due to significant symptoms of anemia The patient is noted to have nose ring piercing on the medial aspect of both sides of her nasal passage She denies a nose ring piercing as a cause of her epistaxis  In the emergency room, she was found to be profoundly anemic with hemoglobin of 5.8 Further testing revealed iron deficiency anemia In addition to receiving blood, she received one dose of IV Feraheme, complicated by hives She still complain of excessive fatigue, shortness of breath on minimal exertion, pre-syncopal episodes, and palpitations. She had not noticed any recent hematuria or hematochezia The patient denies over the counter NSAID ingestion. She is not on antiplatelets agents.  She had no prior history or diagnosis of cancer. Her age appropriate screening programs are up-to-date. She has pica with ice craving but eats a variety of diet. She never donated blood The patient was prescribed oral iron supplements but the iron supplement did not help MEDICAL HISTORY:  Past Medical History:  Diagnosis Date  . ADD (attention deficit disorder)   . Anemia     SURGICAL HISTORY: History reviewed. No pertinent surgical history.  SOCIAL HISTORY: Social History   Socioeconomic History  . Marital status: Single    Spouse name: Not on file  . Number of children: Not on file  . Years of education: Not on file  .  Highest education level: Not on file  Occupational History  . Not on file  Tobacco Use  . Smoking status: Never Smoker  . Smokeless tobacco: Never Used  Vaping Use  . Vaping Use: Never used  Substance and Sexual Activity  . Alcohol use: Yes    Comment: occ  . Drug use: Never  . Sexual activity: Not on file  Other Topics Concern  . Not on file  Social History Narrative  . Not on file   Social Determinants of Health   Financial Resource Strain: Not on file  Food Insecurity: Not on file  Transportation Needs: Not on file  Physical Activity: Not on file  Stress: Not on file  Social Connections: Not on file  Intimate Partner Violence: Not on file    FAMILY HISTORY: Family History  Problem Relation Age of Onset  . Asthma Mother   . Cancer Father     ALLERGIES:  has No Known Allergies.  MEDICATIONS:  Current Outpatient Medications  Medication Sig Dispense Refill  . amphetamine-dextroamphetamine (ADDERALL) 20 MG tablet Take 20 mg by mouth daily.     No current facility-administered medications for this visit.    REVIEW OF SYSTEMS:   Constitutional: Denies fevers, chills or abnormal night sweats Eyes: Denies blurriness of vision, double vision or watery eyes Ears, nose, mouth, throat, and face: Denies mucositis or sore throat Respiratory: Denies cough, dyspnea or wheezes Cardiovascular: Denies palpitation, chest discomfort or lower extremity swelling Gastrointestinal:  Denies nausea, heartburn or change in bowel habits Skin: Denies abnormal skin rashes Lymphatics: Denies new lymphadenopathy or easy  bruising Neurological:Denies numbness, tingling or new weaknesses Behavioral/Psych: Mood is stable, no new changes  All other systems were reviewed with the patient and are negative.  PHYSICAL EXAMINATION: ECOG PERFORMANCE STATUS: 1 - Symptomatic but completely ambulatory  Vitals:   06/29/20 1141  BP: 109/78  Pulse: 81  Resp: 12  Temp: 98.4 F (36.9 C)  SpO2:  100%   Filed Weights   06/29/20 1141  Weight: 135 lb 9.6 oz (61.5 kg)    GENERAL:alert, no distress and comfortable SKIN: skin color, texture, turgor are normal, no rashes or significant lesions EYES: normal, conjunctiva are pink and non-injected, sclera clear OROPHARYNX:no exudate, no erythema and lips, buccal mucosa, and tongue normal  NECK: supple, thyroid normal size, non-tender, without nodularity LYMPH:  no palpable lymphadenopathy in the cervical, axillary or inguinal LUNGS: clear to auscultation and percussion with normal breathing effort HEART: regular rate & rhythm and no murmurs and no lower extremity edema ABDOMEN:abdomen soft, non-tender and normal bowel sounds Musculoskeletal:no cyanosis of digits and no clubbing  PSYCH: alert & oriented x 3 with fluent speech NEURO: no focal motor/sensory deficits  ASSESSMENT & PLAN:  Iron deficiency anemia She has chronic iron deficiency anemia all her life Despite being on oral iron supplement previously, she had no response to treatment She has received blood transfusions along with intravenous iron infusion in the hospital  The most likely cause of her anemia is due to chronic blood loss/malabsorption syndrome. We discussed some of the risks, benefits, and alternatives of intravenous iron infusions. The patient is symptomatic from anemia and the iron level is critically low. She tolerated oral iron supplement poorly and desires to achieved higher levels of iron faster for adequate hematopoesis. Some of the side-effects to be expected including risks of infusion reactions, phlebitis, headaches, nausea and fatigue.  The patient is willing to proceed. Patient education material was dispensed.  Goal is to keep ferritin level greater than 50 and resolution of anemia Due to reaction to IV Feraheme, I will prescribe IV sucrose with premedication She would need at least three doses of IV iron for adequate replenishment of iron supply I plan  to see her within 2 months after last dose of IV iron to recheck her blood count and iron studies and to determine whether she would need further IV iron infusion   Epistaxis She has recurrent epistaxis The patient is wearing a nose ring I recommend the patient to stop using it because it can exacerbate further epistaxis  Orders Placed This Encounter  Procedures  . CBC with Differential/Platelet    Standing Status:   Standing    Number of Occurrences:   22    Standing Expiration Date:   06/29/2021  . Ferritin    Standing Status:   Standing    Number of Occurrences:   3    Standing Expiration Date:   06/29/2021  . Iron and TIBC    Standing Status:   Standing    Number of Occurrences:   3    Standing Expiration Date:   06/29/2021  . ABO/RH    Standing Status:   Future    Number of Occurrences:   1    Standing Expiration Date:   06/29/2021  . Sample to Blood Bank    Standing Status:   Standing    Number of Occurrences:   33    Standing Expiration Date:   06/29/2021    All questions were answered. The patient knows to call the clinic  with any problems, questions or concerns.  The total time spent in the appointment was 55 minutes encounter with patients including review of chart and various tests results, discussions about plan of care and coordination of care plan  Artis Delay, MD 2/22/202212:01 PM       Artis Delay, MD 06/29/20 12:01 PM

## 2020-06-29 NOTE — Assessment & Plan Note (Signed)
She has chronic iron deficiency anemia all her life Despite being on oral iron supplement previously, she had no response to treatment She has received blood transfusions along with intravenous iron infusion in the hospital  The most likely cause of her anemia is due to chronic blood loss/malabsorption syndrome. We discussed some of the risks, benefits, and alternatives of intravenous iron infusions. The patient is symptomatic from anemia and the iron level is critically low. She tolerated oral iron supplement poorly and desires to achieved higher levels of iron faster for adequate hematopoesis. Some of the side-effects to be expected including risks of infusion reactions, phlebitis, headaches, nausea and fatigue.  The patient is willing to proceed. Patient education material was dispensed.  Goal is to keep ferritin level greater than 50 and resolution of anemia Due to reaction to IV Feraheme, I will prescribe IV sucrose with premedication She would need at least three doses of IV iron for adequate replenishment of iron supply I plan to see her within 2 months after last dose of IV iron to recheck her blood count and iron studies and to determine whether she would need further IV iron infusion

## 2020-06-29 NOTE — Assessment & Plan Note (Signed)
She has recurrent epistaxis The patient is wearing a nose ring I recommend the patient to stop using it because it can exacerbate further epistaxis

## 2020-07-06 ENCOUNTER — Inpatient Hospital Stay: Payer: Medicaid - Out of State

## 2020-07-13 ENCOUNTER — Inpatient Hospital Stay: Payer: Medicaid - Out of State

## 2020-07-20 ENCOUNTER — Inpatient Hospital Stay: Payer: Medicaid - Out of State

## 2020-09-20 ENCOUNTER — Inpatient Hospital Stay: Payer: Medicaid - Out of State | Attending: Hematology and Oncology | Admitting: Hematology and Oncology

## 2020-09-20 ENCOUNTER — Inpatient Hospital Stay: Payer: Medicaid - Out of State

## 2020-09-20 ENCOUNTER — Encounter: Payer: Self-pay | Admitting: Hematology and Oncology

## 2020-10-05 ENCOUNTER — Emergency Department (HOSPITAL_COMMUNITY)
Admission: EM | Admit: 2020-10-05 | Discharge: 2020-10-05 | Disposition: A | Discharge status: Home / Self Care | Payer: Medicaid - Out of State | Attending: Emergency Medicine | Admitting: Emergency Medicine

## 2020-10-05 ENCOUNTER — Other Ambulatory Visit: Payer: Self-pay

## 2020-10-05 ENCOUNTER — Emergency Department (HOSPITAL_COMMUNITY): Payer: Medicaid - Out of State

## 2020-10-05 ENCOUNTER — Encounter (HOSPITAL_COMMUNITY): Payer: Self-pay | Admitting: *Deleted

## 2020-10-05 DIAGNOSIS — R109 Unspecified abdominal pain: Secondary | ICD-10-CM | POA: Diagnosis not present

## 2020-10-05 DIAGNOSIS — Z8616 Personal history of COVID-19: Secondary | ICD-10-CM | POA: Diagnosis not present

## 2020-10-05 DIAGNOSIS — R509 Fever, unspecified: Secondary | ICD-10-CM | POA: Diagnosis not present

## 2020-10-05 LAB — URINALYSIS, ROUTINE W REFLEX MICROSCOPIC
Bacteria, UA: NONE SEEN
Bilirubin Urine: NEGATIVE
Glucose, UA: NEGATIVE mg/dL
Hgb urine dipstick: NEGATIVE
Ketones, ur: 20 mg/dL — AB
Leukocytes,Ua: NEGATIVE
Nitrite: NEGATIVE
Protein, ur: 30 mg/dL — AB
Specific Gravity, Urine: 1.028 (ref 1.005–1.030)
pH: 5 (ref 5.0–8.0)

## 2020-10-05 LAB — COMPREHENSIVE METABOLIC PANEL
ALT: 17 U/L (ref 0–44)
AST: 22 U/L (ref 15–41)
Albumin: 3.9 g/dL (ref 3.5–5.0)
Alkaline Phosphatase: 59 U/L (ref 38–126)
Anion gap: 7 (ref 5–15)
BUN: 10 mg/dL (ref 6–20)
CO2: 24 mmol/L (ref 22–32)
Calcium: 9 mg/dL (ref 8.9–10.3)
Chloride: 106 mmol/L (ref 98–111)
Creatinine, Ser: 0.76 mg/dL (ref 0.44–1.00)
GFR, Estimated: >60 mL/min (ref >60)
Glucose, Bld: 116 mg/dL — ABNORMAL HIGH (ref 70–99)
Potassium: 3.9 mmol/L (ref 3.5–5.1)
Sodium: 137 mmol/L (ref 135–145)
Total Bilirubin: 0.4 mg/dL (ref 0.3–1.2)
Total Protein: 7.3 g/dL (ref 6.5–8.1)

## 2020-10-05 LAB — CBC
HCT: 33.3 % — ABNORMAL LOW (ref 36.0–46.0)
Hemoglobin: 9.8 g/dL — ABNORMAL LOW (ref 12.0–15.0)
MCH: 21.1 pg — ABNORMAL LOW (ref 26.0–34.0)
MCHC: 29.4 g/dL — ABNORMAL LOW (ref 30.0–36.0)
MCV: 71.6 fL — ABNORMAL LOW (ref 80.0–100.0)
Platelets: 282 10*3/uL (ref 150–400)
RBC: 4.65 MIL/uL (ref 3.87–5.11)
RDW: 17.1 % — ABNORMAL HIGH (ref 11.5–15.5)
WBC: 8.3 10*3/uL (ref 4.0–10.5)
nRBC: 0 % (ref 0.0–0.2)

## 2020-10-05 LAB — LIPASE, BLOOD: Lipase: 46 U/L (ref 11–51)

## 2020-10-05 LAB — I-STAT BETA HCG BLOOD, ED (MC, WL, AP ONLY): I-stat hCG, quantitative: <5 m[IU]/mL (ref <5)

## 2020-10-05 MED ORDER — ONDANSETRON 4 MG PO TBDP: 4.0000 mg | ORAL_TABLET | Freq: Three times a day (TID) | ORAL | 0 refills | Status: DC | PRN

## 2020-10-05 MED ORDER — FAMOTIDINE IN NACL 20-0.9 MG/50ML-% IV SOLN
20.0000 mg | Freq: Once | INTRAVENOUS | Status: AC
  Administered 2020-10-05: 20 mg via INTRAVENOUS
  Filled 2020-10-05: qty 50

## 2020-10-05 MED ORDER — LIDOCAINE VISCOUS HCL 2 % MT SOLN
15.0000 mL | Freq: Once | OROMUCOSAL | Status: AC
  Administered 2020-10-05: 15 mL via ORAL
  Filled 2020-10-05: qty 15

## 2020-10-05 MED ORDER — ALUM & MAG HYDROXIDE-SIMETH 200-200-20 MG/5ML PO SUSP
30.0000 mL | Freq: Once | ORAL | Status: AC
  Administered 2020-10-05: 30 mL via ORAL
  Filled 2020-10-05: qty 30

## 2020-10-05 MED ORDER — FAMOTIDINE 20 MG PO TABS: 20.0000 mg | ORAL_TABLET | Freq: Two times a day (BID) | ORAL | 2 refills | Status: DC

## 2020-10-05 MED ORDER — KETOROLAC TROMETHAMINE 15 MG/ML IJ SOLN
15.0000 mg | Freq: Once | INTRAMUSCULAR | Status: AC
  Administered 2020-10-05: 15 mg via INTRAVENOUS
  Filled 2020-10-05: qty 1

## 2020-10-05 MED ORDER — OMEPRAZOLE 20 MG PO CPDR: 20.0000 mg | DELAYED_RELEASE_CAPSULE | Freq: Every day | ORAL | 2 refills | Status: DC

## 2020-10-05 MED ORDER — SODIUM CHLORIDE 0.9 % IV SOLN
12.5000 mg | Freq: Once | INTRAVENOUS | Status: AC
  Administered 2020-10-05: 12.5 mg via INTRAVENOUS
  Filled 2020-10-05: qty 12.5

## 2020-10-05 MED ORDER — DICYCLOMINE HCL 20 MG PO TABS: 20.0000 mg | ORAL_TABLET | Freq: Two times a day (BID) | ORAL | 0 refills | Status: DC | PRN

## 2020-10-05 NOTE — Discharge Instructions (Signed)
You may be having a viral stomach bug, causing vomiting and gastritis.  This means there is irritation in your stomach.  I prescribed you 2 medications to help with the acid levels and irritations in your stomach.  Please stick to a light diet, including mostly liquids, for the next few days.  Avoid very spicy, very greasy, and very heavy foods including meats for at least the next 7 days.  If you continue having belly pain moving forward, talk to your primary care doctor about this.  You may have an ulcer in your stomach that needs the attention of a gastroenterologist.  The famotidine omeprazole I prescribed can help manage this kind of condition.  Please read over the attached information about abdominal pain, paying particular attention for reasons to come back to the ER.

## 2020-10-05 NOTE — ED Triage Notes (Signed)
Pt reports nausea, fever, emesis since yesterday. Also has mid back pain.

## 2020-10-05 NOTE — ED Provider Notes (Signed)
[facility]-EMERGENCY DEPT Provider Note   CSN: [id number] Arrival date & time: 10/05/20  1121     History Chief Complaint  Patient presents with  . Fever  . Emesis    Teresa Middleton is a 25 y.o. female with a history of iron deficiency anemia, COVID infection in December 2021, presenting the ED with abdominal pain and fever.  Patient reports that she began having nausea, epigastric pain 2 days ago.  She began having a fever yesterday.  His history is had persistent vomiting.  She reports very sharp pain in her epigastrium as soon as she tries to eat or drink anything, prompting vomiting.  She says she has never had this kind of problem before.  She also reports cramping of the lower abdomen.  She reports her last bowel movement was 2 days ago, and is not sure if she is still passing gas.  She denies any history of abdominal surgery.  She denies any vaginal discharge or bleeding.  She denies any new sexual partners or concerns for exposure to STI.  She is not on her menses currently.  HPI     Past Medical History:  Diagnosis Date  . ADD (attention deficit disorder)   . Anemia     Patient Active Problem List   Diagnosis Date Noted  . Epistaxis 06/14/2020  . COVID-19 virus infection 06/14/2020  . Iron deficiency anemia 06/14/2020  . Symptomatic anemia 06/13/2020    History reviewed. No pertinent surgical history.   OB History   No obstetric history on file.     Family History  Problem Relation Age of Onset  . Asthma Mother   . Cancer Father     Social History   Tobacco Use  . Smoking status: Never Smoker  . Smokeless tobacco: Never Used  Vaping Use  . Vaping Use: Never used  Substance Use Topics  . Alcohol use: Yes    Comment: occ  . Drug use: Never    Home Medications Prior to Admission medications   Medication Sig Start Date End Date Taking? Authorizing Provider  dicyclomine (BENTYL) 20 MG tablet Take 1 tablet (20 mg total) by mouth  2 (two) times daily as needed for up to 20 days for spasms. For stomach cramping 10/05/20 10/25/20 Yes Kashara Blocher, Kermit Balo, MD  famotidine (PEPCID) 20 MG tablet Take 1 tablet (20 mg total) by mouth 2 (two) times daily. 10/05/20 11/04/20 Yes Karma Ansley, Kermit Balo, MD  omeprazole (PRILOSEC) 20 MG capsule Take 1 capsule (20 mg total) by mouth daily for 30 doses. 10/05/20 11/04/20 Yes Aairah Negrette, Kermit Balo, MD  ondansetron (ZOFRAN ODT) 4 MG disintegrating tablet Take 1 tablet (4 mg total) by mouth every 8 (eight) hours as needed for up to 15 doses for nausea or vomiting. 10/05/20  Yes Neka Bise, Kermit Balo, MD  amphetamine-dextroamphetamine (ADDERALL) 20 MG tablet Take 20 mg by mouth daily. 05/25/20   [provider]    Allergies    Patient has no known allergies.  Review of Systems   Review of Systems  Constitutional: Negative for chills and fever.  Respiratory: Negative for cough and shortness of breath.   Cardiovascular: Negative for chest pain and palpitations.  Gastrointestinal: Positive for abdominal pain, nausea and vomiting.  Genitourinary: Negative for dysuria and hematuria.  Musculoskeletal: Negative for arthralgias and myalgias.  Skin: Negative for color change and rash.  Neurological: Negative for syncope and speech difficulty.  All other systems reviewed and are negative.   Physical Exam  Updated Vital Signs BP 108/75   Pulse 94   Temp (!) 100.4 F (38 C)   Resp 18   LMP 09/15/2020   SpO2 99%   Physical Exam Constitutional:      General: She is not in acute distress. HENT:     Head: Normocephalic and atraumatic.  Eyes:     Conjunctiva/sclera: Conjunctivae normal.     Pupils: Pupils are equal, round, and reactive to light.  Cardiovascular:     Rate and Rhythm: Normal rate and regular rhythm.  Pulmonary:     Effort: Pulmonary effort is normal. No respiratory distress.  Abdominal:     General: There is no distension.     Tenderness: There is abdominal tenderness in the  epigastric area and suprapubic area. There is no right CVA tenderness or left CVA tenderness. Negative signs include Murphy's sign and McBurney's sign.  Skin:    General: Skin is warm and dry.  Neurological:     General: No focal deficit present.     Mental Status: She is alert. Mental status is at baseline.  Psychiatric:        Mood and Affect: Mood normal.        Behavior: Behavior normal.     ED Results / Procedures / Treatments   Labs (all labs ordered are listed, but only abnormal results are displayed) Labs Reviewed  COMPREHENSIVE METABOLIC PANEL - Abnormal; Notable for the following components:      Result Value   Glucose, Bld 116 (*)    All other components within normal limits  CBC - Abnormal; Notable for the following components:   Hemoglobin 9.8 (*)    HCT 33.3 (*)    MCV 71.6 (*)    MCH 21.1 (*)    MCHC 29.4 (*)    RDW 17.1 (*)    All other components within normal limits  URINALYSIS, ROUTINE W REFLEX MICROSCOPIC - Abnormal; Notable for the following components:   APPearance HAZY (*)    Ketones, ur 20 (*)    Protein, ur 30 (*)    All other components within normal limits  LIPASE, BLOOD  I-STAT BETA HCG BLOOD, ED ([facility], [facility], [facility] ONLY)    EKG None  Radiology CT ABDOMEN PELVIS WO CONTRAST  Result Date: 10/05/2020 CLINICAL DATA:  Onset nausea, vomiting and fever yesterday. Back pain. EXAM: CT ABDOMEN AND PELVIS WITHOUT CONTRAST TECHNIQUE: Multidetector CT imaging of the abdomen and pelvis was performed following the standard protocol without IV contrast. COMPARISON:  None. FINDINGS: Lower chest: Lung bases are clear. No pleural or pericardial effusion. Hepatobiliary: No focal liver abnormality is seen. No gallstones, gallbladder wall thickening, or biliary dilatation. Pancreas: Unremarkable. No pancreatic ductal dilatation or surrounding inflammatory changes. Spleen: Normal in size without focal abnormality. Adrenals/Urinary Tract: Adrenal glands are unremarkable.  Kidneys are normal, without renal calculi, focal lesion, or hydronephrosis. Bladder is unremarkable. Stomach/Bowel: Stomach is within normal limits. Appendix appears normal. No evidence of bowel wall thickening, distention, or inflammatory changes. Vascular/Lymphatic: No significant vascular findings are present. No enlarged abdominal or pelvic lymph nodes. Reproductive: Uterus and bilateral adnexa are unremarkable. Other: None. Musculoskeletal: Negative. IMPRESSION: Negative CT abdomen and pelvis. No finding to explain the patient's symptoms. Electronically Signed   By: Drusilla Kanner M.D.   On: 10/05/2020 17:59    Procedures Procedures   Medications Ordered in ED Medications  promethazine (PHENERGAN) 12.5 mg in sodium chloride 0.9 % 50 mL IVPB (0 mg Intravenous Stopped 10/05/20 2019)  alum &  mag hydroxide-simeth (MAALOX/MYLANTA) 200-200-20 MG/5ML suspension 30 mL (30 mLs Oral Given 10/05/20 1727)    And  lidocaine (XYLOCAINE) 2 % viscous mouth solution 15 mL (15 mLs Oral Given 10/05/20 1727)  famotidine (PEPCID) IVPB 20 mg premix (0 mg Intravenous Stopped 10/05/20 2019)  ketorolac (TORADOL) 15 MG/ML injection 15 mg (15 mg Intravenous Given 10/05/20 1727)    ED Course  I have reviewed the triage vital signs and the nursing notes.  Pertinent labs & imaging results that were available during my care of the patient were reviewed by me and considered in my medical decision making (see chart for details).  This patient presents to the Emergency Department with complaint of abdominal pain. This involves an extensive number of treatment options, and is a complaint that carries with it a high risk of complications and morbidity.  The differential diagnosis includes, but is not limited to, gastritis vs biliary disease vs peptic ulcer vs constipation vs colitis vs UTI vs other  I ordered, reviewed, and interpreted labs, including CBC normal, UA without sign of infection, CMP unremarkable, lipase normal.   Hgb 9.8 which is close to patient's baseline with known iron deficiency anemia. I ordered medication for abdominal pain and/or nausea I ordered imaging studies which included CT abdomen/pelvis without contrast I independently visualized and interpreted imaging which showed no acute inflammatory or infectious findings to suggest etiology for her pain, and the monitor tracing which showed   This patient was evaluated during a time of global shortage of iodinated contrast media. Based on guidance from the [employer] of Radiology, best practices, and local institutional approaches an alternative path for evaluating and managing the patient may have been employed in order to provide optimal care during this shortage. The current situation has been discussed with the patient.  After the interventions stated above, I reevaluated the patient and found that they remained clinically stable - abd pain and nausea improved with medications.  Based on the patient's clinical exam, vital signs, risk factors, and ED testing, I felt that the patient's overall risk of life-threatening emergency such as bowel perforation, pelvic infection or ovarian torsion, surgical emergency, or sepsis was quite low.  I suspect this clinical presentation is most consistent with viral gastritis, but explained to the patient that this evaluation was not a definitive diagnostic workup.  I prescribed medications for gastritis and nausea for home, and advised a liquid/soft diet for the next several days.  I discussed outpatient follow up with primary care provider, and provided specialist office number on the patient's discharge paper if a referral was deemed necessary.  I discussed return precautions with the patient. I felt the patient was clinically stable for discharge.   Clinical Course as of 10/06/20 1027  Tue Oct 05, 2020  2002 Tolerated PO fluids.  Feels nausea and pain overall improved.  Suspect this is a viral illness -  lower suspicion for acute appendicitis, biliary disease, or other surgical emergency or bacterial infection with her clinical exam and workup.  Okay for discharge [MT]    Clinical Course User Index [MT] Terald Sleeper, MD    Final Clinical Impression(s) / ED Diagnoses Final diagnoses:  Abdominal pain, unspecified abdominal location  Fever, unspecified fever cause    Rx / DC Orders ED Discharge Orders         Ordered    dicyclomine (BENTYL) 20 MG tablet  2 times daily PRN        10/05/20 2005  ondansetron (ZOFRAN ODT) 4 MG disintegrating tablet  Every 8 hours PRN        10/05/20 2005    omeprazole (PRILOSEC) 20 MG capsule  Daily        10/05/20 2005    famotidine (PEPCID) 20 MG tablet  2 times daily        10/05/20 2005           Terald Sleeperrifan, Railynn Ballo J, MD 10/06/20 1027

## 2020-10-19 ENCOUNTER — Encounter: Payer: Self-pay | Admitting: Hematology and Oncology

## 2021-06-10 ENCOUNTER — Encounter (HOSPITAL_COMMUNITY): Payer: Self-pay

## 2021-06-10 ENCOUNTER — Other Ambulatory Visit: Payer: Self-pay

## 2021-06-10 ENCOUNTER — Emergency Department (HOSPITAL_COMMUNITY)
Admission: EM | Admit: 2021-06-10 | Discharge: 2021-06-10 | Disposition: A | Discharge status: Home / Self Care | Payer: 59 | Attending: Emergency Medicine | Admitting: Emergency Medicine

## 2021-06-10 DIAGNOSIS — N939 Abnormal uterine and vaginal bleeding, unspecified: Secondary | ICD-10-CM | POA: Diagnosis present

## 2021-06-10 LAB — RPR: RPR Ser Ql: NONREACTIVE

## 2021-06-10 LAB — GC/CHLAMYDIA PROBE AMP (~~LOC~~) NOT AT ARMC
Chlamydia: NEGATIVE
Comment: NEGATIVE
Comment: NORMAL
Neisseria Gonorrhea: NEGATIVE

## 2021-06-10 LAB — URINALYSIS, ROUTINE W REFLEX MICROSCOPIC
Bacteria, UA: NONE SEEN
Bilirubin Urine: NEGATIVE
Glucose, UA: NEGATIVE mg/dL
Ketones, ur: NEGATIVE mg/dL
Leukocytes,Ua: NEGATIVE
Nitrite: NEGATIVE
Protein, ur: NEGATIVE mg/dL
RBC / HPF: >50 RBC/hpf — ABNORMAL HIGH (ref 0–5)
Specific Gravity, Urine: 1.025 (ref 1.005–1.030)
pH: 6.5 (ref 5.0–8.0)

## 2021-06-10 LAB — CBC
HCT: 27.3 % — ABNORMAL LOW (ref 36.0–46.0)
Hemoglobin: 7.1 g/dL — ABNORMAL LOW (ref 12.0–15.0)
MCH: 16 pg — ABNORMAL LOW (ref 26.0–34.0)
MCHC: 26 g/dL — ABNORMAL LOW (ref 30.0–36.0)
MCV: 61.6 fL — ABNORMAL LOW (ref 80.0–100.0)
Platelets: 401 10*3/uL — ABNORMAL HIGH (ref 150–400)
RBC: 4.43 MIL/uL (ref 3.87–5.11)
RDW: 22.5 % — ABNORMAL HIGH (ref 11.5–15.5)
WBC: 5.2 10*3/uL (ref 4.0–10.5)
nRBC: 0 % (ref 0.0–0.2)

## 2021-06-10 LAB — WET PREP, GENITAL
Clue Cells Wet Prep HPF POC: NONE SEEN
Sperm: NONE SEEN
Trich, Wet Prep: NONE SEEN
WBC, Wet Prep HPF POC: <10 (ref <10)
Yeast Wet Prep HPF POC: NONE SEEN

## 2021-06-10 LAB — I-STAT BETA HCG BLOOD, ED (MC, WL, AP ONLY): I-stat hCG, quantitative: <5 m[IU]/mL (ref <5)

## 2021-06-10 LAB — BASIC METABOLIC PANEL
Anion gap: 5 (ref 5–15)
BUN: 9 mg/dL (ref 6–20)
CO2: 24 mmol/L (ref 22–32)
Calcium: 9.3 mg/dL (ref 8.9–10.3)
Chloride: 109 mmol/L (ref 98–111)
Creatinine, Ser: 0.83 mg/dL (ref 0.44–1.00)
GFR, Estimated: >60 mL/min (ref >60)
Glucose, Bld: 95 mg/dL (ref 70–99)
Potassium: 3.6 mmol/L (ref 3.5–5.1)
Sodium: 138 mmol/L (ref 135–145)

## 2021-06-10 LAB — ABO/RH: ABO/RH(D): O POS

## 2021-06-10 MED ORDER — MEGESTROL ACETATE 40 MG PO TABS: ORAL_TABLET | ORAL | 0 refills | Status: DC

## 2021-06-10 NOTE — ED Provider Notes (Signed)
[facility]-EMERGENCY DEPT Provider Note   CSN: [id number] Arrival date & time: 06/10/21  0031     History  Chief Complaint  Patient presents with   Vaginal Bleeding    Teresa Middleton is a 26 y.o. female who presents emergency department with a chief complaint of vaginal bleeding.  Patient states that she has always had very irregular periods.  Her LMP was on 119 and she began having bleeding again today.  She states this is very abnormal for her.  Patient just became sexually active recently.  She has no previous gynecologic examination.  She has a longstanding history of fairly severe anemia and once had a nosebleed that caused her to have a blood transfusion.  She states her lowest hemoglobin has been "in the fours."  She denies chest pain, shortness of breath.  She has seen a hematologist in the past but has been many years.  She denies vaginal discharge or pelvic pain   Vaginal Bleeding     Home Medications Prior to Admission medications   Medication Sig Start Date End Date Taking? Authorizing Provider  megestrol (MEGACE) 40 MG tablet 3 tabs a day for 5 days(all at once) 2 tabs a day for 5 days(all at once) then 1 tablet a day 06/10/21  Yes Takeshia Wenk, PA-C  amphetamine-dextroamphetamine (ADDERALL) 20 MG tablet Take 20 mg by mouth daily. 05/25/20   [provider]  dicyclomine (BENTYL) 20 MG tablet Take 1 tablet (20 mg total) by mouth 2 (two) times daily as needed for up to 20 days for spasms. For stomach cramping 10/05/20 10/25/20  Terald Sleeper, MD  famotidine (PEPCID) 20 MG tablet Take 1 tablet (20 mg total) by mouth 2 (two) times daily. 10/05/20 11/04/20  Terald Sleeper, MD  omeprazole (PRILOSEC) 20 MG capsule Take 1 capsule (20 mg total) by mouth daily for 30 doses. 10/05/20 11/04/20  Terald Sleeper, MD  ondansetron (ZOFRAN ODT) 4 MG disintegrating tablet Take 1 tablet (4 mg total) by mouth every 8 (eight) hours as needed for up to 15 doses for  nausea or vomiting. 10/05/20   Trifan, Kermit Balo, MD      Allergies    Patient has no known allergies.    Review of Systems   Review of Systems  Genitourinary:  Positive for vaginal bleeding.   Physical Exam Updated Vital Signs BP 111/68    Pulse 88    Temp 98.3 F (36.8 C) (Oral)    Resp 16    Ht 5\' 5"  (1.651 m)    Wt 69.4 kg    LMP 05/26/2021 (Exact Date)    SpO2 100%    BMI 25.46 kg/m  Physical Exam Vitals and nursing note reviewed. Exam conducted with a chaperone present.  Constitutional:      General: She is not in acute distress.    Appearance: She is well-developed. She is not diaphoretic.  HENT:     Head: Normocephalic and atraumatic.     Right Ear: External ear normal.     Left Ear: External ear normal.     Nose: Nose normal.     Mouth/Throat:     Mouth: Mucous membranes are moist.  Eyes:     General: No scleral icterus.    Conjunctiva/sclera: Conjunctivae normal.  Cardiovascular:     Rate and Rhythm: Normal rate and regular rhythm.     Heart sounds: Normal heart sounds. No murmur heard.   No friction rub. No gallop.  Pulmonary:     Effort: Pulmonary effort is normal. No respiratory distress.     Breath sounds: Normal breath sounds.  Abdominal:     General: Bowel sounds are normal. There is no distension.     Palpations: Abdomen is soft. There is no mass.     Tenderness: There is no abdominal tenderness. There is no guarding.  Genitourinary:    General: Normal vulva.     Comments: Normal external female genitalia, vagina well rugated, pink and without discharge.  Cervix with nulliparous os, active dark blood emanating from the os, no CMT, no adnexal fullness or tenderness Musculoskeletal:     Cervical back: Normal range of motion.  Skin:    General: Skin is warm and dry.  Neurological:     Mental Status: She is alert and oriented to person, place, and time.  Psychiatric:        Behavior: Behavior normal.    ED Results / Procedures / Treatments    Labs (all labs ordered are listed, but only abnormal results are displayed) Labs Reviewed  CBC - Abnormal; Notable for the following components:      Result Value   Hemoglobin 7.1 (*)    HCT 27.3 (*)    MCV 61.6 (*)    MCH 16.0 (*)    MCHC 26.0 (*)    RDW 22.5 (*)    Platelets 401 (*)    All other components within normal limits  URINALYSIS, ROUTINE W REFLEX MICROSCOPIC - Abnormal; Notable for the following components:   Hgb urine dipstick LARGE (*)    RBC / HPF >50 (*)    All other components within normal limits  WET PREP, GENITAL  BASIC METABOLIC PANEL  RPR  I-STAT BETA HCG BLOOD, ED ([facility], [facility], [facility] ONLY)  ABO/RH  GC/CHLAMYDIA PROBE AMP ([place]) NOT AT [facility]    EKG None  Radiology No results found.  Procedures Procedures    Medications Ordered in ED Medications - No data to display  ED Course/ Medical Decision Making/ A&P                           Medical Decision Making Patient here with abnormal vaginal bleeding  Labs reviewed show chronic anemia and negative pregnancy Differential diagnosis for nonpregnant vaginal bleeding includes but is not limited to systemic causes such as cirrhosis of the liver, coagulopathy such as ITP or von Willebrand's disease, strep vaginitis, HRT, hypothyroidism, secondary anovulation.  Reproductive tract causes include adenomyosis, atrophic endometrium, dysfunctional uterine bleeding, endometriosis, fibroids, foreign body, infection, IUD, neoplasia or vaginal trauma. Additional labs show wet prep wnl, neg UA - cutlures pending Given patient's tenuous anemia and hx of need for transfusion, will start the patient on megace. She will need referral to GYN for further mgmt and eval. Appears appropriate for dc at this time.   Amount and/or Complexity of Data Reviewed Labs: ordered.  Risk Prescription drug management.    Final Clinical Impression(s) / ED Diagnoses Final diagnoses:  Abnormal uterine bleeding (AUB)    Rx / DC  Orders ED Discharge Orders          Ordered    megestrol (MEGACE) 40 MG tablet        06/10/21 0257              Arthor Captain, PA-C 06/10/21 1506    Sabas Sous, MD 06/11/21 [phone]

## 2021-06-10 NOTE — Discharge Instructions (Addendum)
Contact a health care provider if: You have bleeding that lasts for more than one week. You feel dizzy at times. You feel nauseous or you vomit. You feel light-headed or weak. You notice any other changes that show that your condition is getting worse. Get help right away if: You faint. You have bleeding that soaks through a sanitary pad every hour. You have pain in the abdomen. You have a fever or chills. You become sweaty or weak. You pass large blood clots from your vagina.

## 2021-06-10 NOTE — ED Triage Notes (Addendum)
Patient arrives from home with complaint of vaginal bleeding that began today. Pt reports having a period 01/19,  and states today she began bleeding again. Endorses left sided pelvic pain. Denies passing any clots.

## 2021-07-01 ENCOUNTER — Ambulatory Visit (INDEPENDENT_AMBULATORY_CARE_PROVIDER_SITE_OTHER): Payer: 59 | Admitting: Family

## 2021-07-01 ENCOUNTER — Other Ambulatory Visit: Payer: Self-pay

## 2021-07-01 ENCOUNTER — Encounter: Payer: Self-pay | Admitting: Family

## 2021-07-01 VITALS — BP 109/68 | HR 82 | Temp 98.0°F | Ht 65.0 in | Wt 158.8 lb

## 2021-07-01 DIAGNOSIS — F900 Attention-deficit hyperactivity disorder, predominantly inattentive type: Secondary | ICD-10-CM | POA: Insufficient documentation

## 2021-07-01 DIAGNOSIS — D509 Iron deficiency anemia, unspecified: Secondary | ICD-10-CM | POA: Diagnosis not present

## 2021-07-01 MED ORDER — AMPHETAMINE-DEXTROAMPHETAMINE 20 MG PO TABS: 20.0000 mg | ORAL_TABLET | Freq: Every day | ORAL | 0 refills | Status: DC

## 2021-07-01 NOTE — Assessment & Plan Note (Signed)
pt is established with Hematology, last Hgb 7.1 beginning of this month, she reports not absorbing iron well thru diet or supplements and requires infusions. States she has heavy menses, but does not want to be on hormones. Advised we could try a lower dose of progesterone, and the different forms, and it does not have to be long term, pt will let me know if she changes her mind. Denies any sx unless Hgb down to 6 or lower.

## 2021-07-01 NOTE — Progress Notes (Signed)
New Patient Office Visit  Subjective:  Patient ID: Teresa Middleton, female    DOB: Jun 07, 1995  Age: 26 y.o. MRN: [id number]  CC:  Chief Complaint  Patient presents with   Establish Care   ADHD    Adderall refill.    HPI Jinelle Butchko presents for establishing care and to discuss two problems. ADHD f/u: Medications helping target goals: Adderall IR - has been on for years, off last 2 mos d/t issues getting RX from PCP Regimen: daily Medication side effects/concerns: none, tolerates well Weight: no change Sleep: no concerns Mood changes: denies Tics: denies Blood pressure, Weight, Pulse, Behavior reviewed: wnl Anemia: Patient presents for presents evaluation of anemia. Anemia was found by  chronic condition, under care of Hematology .  It has been present for  many  years.  Associated signs & symptoms: reports fatigue if Hgb goes down closer to 6 or lower range.     Past Medical History:  Diagnosis Date   ADD (attention deficit disorder)    Anemia     History reviewed. No pertinent surgical history.  Family History  Problem Relation Age of Onset   Asthma Mother    Cancer Father     Social History   Socioeconomic History   Marital status: Single    Spouse name: Not on file   Number of children: Not on file   Years of education: Not on file   Highest education level: Not on file  Occupational History   Not on file  Tobacco Use   Smoking status: Never   Smokeless tobacco: Never  Vaping Use   Vaping Use: Never used  Substance and Sexual Activity   Alcohol use: Not Currently    Comment: occ   Drug use: Never   Sexual activity: Not Currently    Birth control/protection: Abstinence, Coitus interruptus  Other Topics Concern   Not on file  Social History Narrative   Not on file   Social Determinants of Health   Financial Resource Strain: Not on file  Food Insecurity: Not on file  Transportation Needs: Not on file  Physical Activity: Not on file  Stress:  Not on file  Social Connections: Not on file  Intimate Partner Violence: Not on file    Objective:   Today's Vitals: BP 109/68    Pulse 82    Temp 98 F (36.7 C) (Temporal)    Ht 5\' 5"  (1.651 m)    Wt 158 lb 12.8 oz (72 kg)    LMP 06/09/2021 (Exact Date)    SpO2 100%    BMI 26.43 kg/m   Physical Exam Vitals and nursing note reviewed.  Constitutional:      Appearance: Normal appearance.  Cardiovascular:     Rate and Rhythm: Normal rate and regular rhythm.  Pulmonary:     Effort: Pulmonary effort is normal.     Breath sounds: Normal breath sounds.  Musculoskeletal:        General: Normal range of motion.  Skin:    General: Skin is warm and dry.  Neurological:     Mental Status: She is alert.  Psychiatric:        Mood and Affect: Mood normal.        Behavior: Behavior normal.    Assessment & Plan:   Problem List Items Addressed This Visit       Other   Chronic iron deficiency anemia - Primary    pt is established with Hematology, last Hgb 7.1 beginning  of this month, she reports not absorbing iron well thru diet or supplements and requires infusions. States she has heavy menses, but does not want to be on hormones. Advised we could try a lower dose of progesterone, and the different forms, and it does not have to be long term, pt will let me know if she changes her mind. Denies any sx unless Hgb down to 6 or lower.      ADHD (attention deficit hyperactivity disorder), inattentive type   Relevant Medications   amphetamine-dextroamphetamine (ADDERALL) 20 MG tablet   amphetamine-dextroamphetamine (ADDERALL) 20 MG tablet (Start on 07/31/2021)   amphetamine-dextroamphetamine (ADDERALL) 20 MG tablet (Start on 08/30/2021)    Outpatient Encounter Medications as of 07/01/2021  Medication Sig   acetaminophen (TYLENOL) 500 MG tablet Take by mouth.   amphetamine-dextroamphetamine (ADDERALL) 20 MG tablet Take 1 tablet (20 mg total) by mouth daily before breakfast.   [START ON  07/31/2021] amphetamine-dextroamphetamine (ADDERALL) 20 MG tablet Take 1 tablet (20 mg total) by mouth daily before breakfast.   [START ON 08/30/2021] amphetamine-dextroamphetamine (ADDERALL) 20 MG tablet Take 1 tablet (20 mg total) by mouth daily before breakfast.   ibuprofen (ADVIL) 200 MG tablet Take by mouth.   [DISCONTINUED] amphetamine-dextroamphetamine (ADDERALL) 20 MG tablet Take 20 mg by mouth daily.   [DISCONTINUED] dicyclomine (BENTYL) 20 MG tablet Take 1 tablet (20 mg total) by mouth 2 (two) times daily as needed for up to 20 days for spasms. For stomach cramping   [DISCONTINUED] famotidine (PEPCID) 20 MG tablet Take 1 tablet (20 mg total) by mouth 2 (two) times daily.   [DISCONTINUED] megestrol (MEGACE) 40 MG tablet 3 tabs a day for 5 days(all at once) 2 tabs a day for 5 days(all at once) then 1 tablet a day (Patient not taking: Reported on 07/01/2021)   [DISCONTINUED] omeprazole (PRILOSEC) 20 MG capsule Take 1 capsule (20 mg total) by mouth daily for 30 doses.   [DISCONTINUED] ondansetron (ZOFRAN ODT) 4 MG disintegrating tablet Take 1 tablet (4 mg total) by mouth every 8 (eight) hours as needed for up to 15 doses for nausea or vomiting. (Patient not taking: Reported on 07/01/2021)   No facility-administered encounter medications on file as of 07/01/2021.    Follow-up: Return in about 3 months (around 09/28/2021) for adhd.   Dulce Sellar, NP

## 2021-07-01 NOTE — Patient Instructions (Addendum)
Welcome to [facility]! It was a pleasure meeting you today.  As discussed, I have sent your Adderall refill to your pharmacy, and they should hold the next 2 months on file, so call them to refill monthly. Please schedule a 3 month follow up visit today.     PLEASE NOTE:  If you had any LAB tests please let us know if you have not heard back within a few days. You may see your results on MyChart before we have a chance to review them but we will give you a call once they are reviewed by [place]. If we ordered any REFERRALS today, please let us know if you have not heard from their office within the next week.  Let us know through MyChart if you are needing REFILLS, or have your pharmacy send [place] the request. You can also use MyChart to communicate with me or any office staff.  Please try these tips to maintain a healthy lifestyle:  Eat most of your calories during the day when you are active. Eliminate processed foods including packaged sweets (pies, cakes, cookies), reduce intake of potatoes, white bread, white pasta, and white rice. Look for whole grain options, oat flour or almond flour.  Each meal should contain half fruits/vegetables, one quarter protein, and one quarter carbs (no bigger than a computer mouse).  Cut down on sweet beverages. This includes juice, soda, and sweet tea. Also watch fruit intake, though this is a healthier sweet option, it still contains natural sugar! Limit to 3 servings daily.  Drink at least 1 glass of water with each meal and aim for at least 8 glasses per day  Exercise at least 150 minutes every week.

## 2021-08-08 ENCOUNTER — Other Ambulatory Visit: Payer: Self-pay | Admitting: Family

## 2021-08-08 DIAGNOSIS — F900 Attention-deficit hyperactivity disorder, predominantly inattentive type: Secondary | ICD-10-CM

## 2021-08-08 NOTE — Telephone Encounter (Signed)
Patient comment: Last month i was given amphetamine salts and normally I take the amphetamine dextro im not sure if there is a difference but i did feel more jittery and less focused on the salts than i have in the past with the dextro so preferably I would like to be prescribed the amphetamine-dextroamphetamine ?

## 2021-08-09 NOTE — Telephone Encounter (Signed)
So I sent in amphetamine-dextroamphetamine RX to the pharmacy - they must have filled the salts because of supply issues - they are essentially the same. She needs to discuss with the pharmacy and tell them she only wants the RX as written.  They have the April RX on file.Thx

## 2021-08-29 ENCOUNTER — Encounter: Payer: 59 | Admitting: Obstetrics & Gynecology

## 2021-09-27 ENCOUNTER — Telehealth: Payer: Self-pay | Admitting: Family

## 2021-09-27 ENCOUNTER — Telehealth: Payer: Self-pay

## 2021-09-27 ENCOUNTER — Ambulatory Visit (INDEPENDENT_AMBULATORY_CARE_PROVIDER_SITE_OTHER): Payer: 59 | Admitting: Family

## 2021-09-27 ENCOUNTER — Encounter: Payer: Self-pay | Admitting: Family

## 2021-09-27 VITALS — BP 93/52 | HR 79 | Temp 98.0°F | Ht 65.0 in | Wt 155.0 lb

## 2021-09-27 DIAGNOSIS — F900 Attention-deficit hyperactivity disorder, predominantly inattentive type: Secondary | ICD-10-CM | POA: Diagnosis not present

## 2021-09-27 DIAGNOSIS — D509 Iron deficiency anemia, unspecified: Secondary | ICD-10-CM | POA: Diagnosis not present

## 2021-09-27 DIAGNOSIS — J45990 Exercise induced bronchospasm: Secondary | ICD-10-CM | POA: Diagnosis not present

## 2021-09-27 DIAGNOSIS — D649 Anemia, unspecified: Secondary | ICD-10-CM | POA: Diagnosis not present

## 2021-09-27 LAB — CBC
HCT: 21.9 % — CL (ref 36.0–46.0)
Hemoglobin: 6.4 g/dL — CL (ref 12.0–15.0)
MCHC: 29.3 g/dL — ABNORMAL LOW (ref 30.0–36.0)
MCV: 53.9 fl — ABNORMAL LOW (ref 78.0–100.0)
Platelets: 355 10*3/uL (ref 150.0–400.0)
RBC: 4.06 Mil/uL (ref 3.87–5.11)
RDW: 22.6 % — ABNORMAL HIGH (ref 11.5–15.5)
WBC: 3.9 10*3/uL — ABNORMAL LOW (ref 4.0–10.5)

## 2021-09-27 MED ORDER — AMPHETAMINE-DEXTROAMPHETAMINE 20 MG PO TABS: 20.0000 mg | ORAL_TABLET | Freq: Every day | ORAL | 0 refills | Status: DC

## 2021-09-27 MED ORDER — ALBUTEROL SULFATE HFA 108 (90 BASE) MCG/ACT IN AERS: 2.0000 | INHALATION_SPRAY | Freq: Four times a day (QID) | RESPIRATORY_TRACT | 1 refills | Status: DC | PRN

## 2021-09-27 NOTE — Telephone Encounter (Signed)
I reviewed last hemoglobin at 7.1 about 3 months ago and has had slight decrease to 6.4-no acute drastic worsening and from the portion of note that is available does not appear she was in acute distress-forwarding to PCP to address next steps tomorrow

## 2021-09-27 NOTE — Progress Notes (Signed)
Deja, please call Norene and let her know about the message below & to schedule with Hematology ASAP when they call.- also start the iron supplement I sent, she will most likely need IV infusion, but it may help a little.  I suspected you were anemic again, very anemic. Your shortness of breath can be due to low Hemoglobin also as we discussed - I am sending an urgent referral to Hematology for you, BE SURE TO CALL THEM BACK! As we talked about, you are doing harm to all of your organs by not treating your anemia!

## 2021-09-27 NOTE — Telephone Encounter (Signed)
Critical Lab  Received at 2:50 From [facility])  HGB: 6.4 HCT: 21.9

## 2021-09-27 NOTE — Assessment & Plan Note (Signed)
Chronic - stable on IR Adderall, sending refill today, f/u in 3 mos.

## 2021-09-27 NOTE — Patient Instructions (Addendum)
It was very nice to see you today!  Go to the lab today for blood work.  I have sent over an albuterol inhaler to use for your cough & shortness of breath. Take 2 puffs every 4 hours as needed, prior to bedtime and exercise.  I also have sent your Adderall refills for 3 months.  Follow up in 3 months.       PLEASE NOTE:  If you had any lab tests please let us know if you have not heard back within a few days. You may see your results on MyChart before we have a chance to review them but we will give you a call once they are reviewed by [place]. If we ordered any referrals today, please let us know if you have not heard from their office within the next week.

## 2021-09-27 NOTE — Telephone Encounter (Signed)
Pt has a critical lab, the lab was told to report this to you since Dulce Sellar is out of office.  HGB: 6.4 HCT: 21.9

## 2021-09-27 NOTE — Assessment & Plan Note (Signed)
Chronic - last Hgb 7.1, pt did not f/u with HEME, here today for ADHD refill, currently on her menses, denies sx, rechecking CBC today.

## 2021-09-27 NOTE — Assessment & Plan Note (Signed)
pt reports hx of Asthma as a child, recently had a virus, but unable to shake the cough, denies waking up during night wheezing or coughing, but has SOB in the evenings, sometimes overnight. Sending Albuterol inhaler, advised on use & SE.

## 2021-09-27 NOTE — Progress Notes (Signed)
Subjective:     Patient ID: Teresa Middleton, female    DOB: 17-Jan-1996, 26 y.o.   MRN: [id number]  Chief Complaint  Patient presents with   ADHD   Shortness of Breath    Pt c/o SOB mostly at night. Would like to discuss inhalers.    HPI: ADHD f/u: Medications helping target goals: Adderall IR Regimen: daily Medication side effects/concerns: none, tolerates well Weight: no change Sleep: no concerns Mood changes: denies Tics: denies Blood pressure, Weight, Pulse, Behavior reviewed: wnl Anemia: Patient presents for presents evaluation of anemia. Anemia was found by chronic condition, under care of Hematology, but not seen since last year and required an iron infusion.  It has been present for many years.  Associated signs & symptoms: reports fatigue if Hgb goes down closer to 6 or lower range. Reports she is not taking any iron currently and reports still having heavy cycles, on her cycle currently. Asthma Follow-up: She has previously been evaluated here for asthma and presents for an asthma follow-up; she is not currently in exacerbation. Symptoms currently include dyspnea and occur daily. Observed precipitants include no identifiable factor.  Current limitations in activity from asthma: none.  Number of days of school or work missed in the last month: 0. Number of Emergency Department visits in the previous month: none. Frequency of use of quick-relief meds: none, pt has no inhaler.    Assessment & Plan:   Problem List Items Addressed This Visit       Respiratory   Asthma, exercise induced    pt reports hx of Asthma as a child, recently had a virus, but unable to shake the cough, denies waking up during night wheezing or coughing, but has SOB in the evenings, sometimes overnight. Sending Albuterol inhaler, advised on use & SE.       Relevant Medications   albuterol (VENTOLIN HFA) 108 (90 Base) MCG/ACT inhaler     Other   Chronic iron deficiency anemia    Chronic - last Hgb 7.1,  pt did not f/u with HEME, here today for ADHD refill, currently on her menses, denies sx, rechecking CBC today.       Relevant Orders   CBC (Completed)   ADHD (attention deficit hyperactivity disorder), inattentive type - Primary    Chronic - stable on IR Adderall, sending refill today, f/u in 3 mos.       Relevant Medications   amphetamine-dextroamphetamine (ADDERALL) 20 MG tablet   amphetamine-dextroamphetamine (ADDERALL) 20 MG tablet (Start on 10/27/2021)   amphetamine-dextroamphetamine (ADDERALL) 20 MG tablet (Start on 11/26/2021)    Outpatient Medications Prior to Visit  Medication Sig Dispense Refill   acetaminophen (TYLENOL) 500 MG tablet Take by mouth.     amphetamine-dextroamphetamine (ADDERALL) 20 MG tablet Take 1 tablet (20 mg total) by mouth daily before breakfast. 30 tablet 0   ibuprofen (ADVIL) 200 MG tablet Take by mouth.     amphetamine-dextroamphetamine (ADDERALL) 20 MG tablet Take 1 tablet (20 mg total) by mouth daily before breakfast. 30 tablet 0   amphetamine-dextroamphetamine (ADDERALL) 20 MG tablet Take 1 tablet (20 mg total) by mouth daily before breakfast. 30 tablet 0   No facility-administered medications prior to visit.    Past Medical History:  Diagnosis Date   ADD (attention deficit disorder)    Anemia     History reviewed. No pertinent surgical history.  No Known Allergies     Objective:    Physical Exam Vitals and nursing note reviewed.  Constitutional:  Appearance: Normal appearance.  Cardiovascular:     Rate and Rhythm: Normal rate and regular rhythm.  Pulmonary:     Effort: Pulmonary effort is normal.     Breath sounds: Normal breath sounds.  Musculoskeletal:        General: Normal range of motion.  Skin:    General: Skin is warm and dry.  Neurological:     Mental Status: She is alert.  Psychiatric:        Mood and Affect: Mood normal.        Behavior: Behavior normal.    BP (!) 93/52 (BP Location: Left Arm, Patient  Position: Sitting, Cuff Size: Large)   Pulse 79   Temp 98 F (36.7 C) (Temporal)   Ht 5\' 5"  (1.651 m)   Wt 155 lb (70.3 kg)   LMP 09/23/2021 (Exact Date)   SpO2 99%   BMI 25.79 kg/m  Wt Readings from Last 3 Encounters:  09/27/21 155 lb (70.3 kg)  07/01/21 158 lb 12.8 oz (72 kg)  06/10/21 153 lb (69.4 kg)        Meds ordered this encounter  Medications   amphetamine-dextroamphetamine (ADDERALL) 20 MG tablet    Sig: Take 1 tablet (20 mg total) by mouth daily before breakfast.    Dispense:  30 tablet    Refill:  0    Order Specific Question:   Supervising Provider    Answer:   ANDY, CAMILLE L [[id number]]   amphetamine-dextroamphetamine (ADDERALL) 20 MG tablet    Sig: Take 1 tablet (20 mg total) by mouth daily before breakfast.    Dispense:  30 tablet    Refill:  0    Order Specific Question:   Supervising Provider    Answer:   ANDY, CAMILLE L [[id number]]   amphetamine-dextroamphetamine (ADDERALL) 20 MG tablet    Sig: Take 1 tablet (20 mg total) by mouth daily before breakfast.    Dispense:  30 tablet    Refill:  0    Order Specific Question:   Supervising Provider    Answer:   ANDY, CAMILLE L [[id number]]   albuterol (VENTOLIN HFA) 108 (90 Base) MCG/ACT inhaler    Sig: Inhale 2 puffs into the lungs every 6 (six) hours as needed for wheezing or shortness of breath.    Dispense:  8 g    Refill:  1    Order Specific Question:   Supervising Provider    Answer:   ANDY, CAMILLE L [[id number]]    08/08/21, NP

## 2021-09-28 MED ORDER — FERROUS SULFATE 324 MG PO TBEC: 324.0000 mg | DELAYED_RELEASE_TABLET | Freq: Every day | ORAL | 5 refills | Status: DC

## 2021-09-28 NOTE — Addendum Note (Signed)
Addended byDulce Sellar on: 09/28/2021 01:22 PM   Modules accepted: Orders

## 2021-09-28 NOTE — Addendum Note (Signed)
Addended byDulce Sellar on: 09/28/2021 01:24 PM   Modules accepted: Orders

## 2021-09-29 ENCOUNTER — Other Ambulatory Visit: Payer: Self-pay | Admitting: Hematology and Oncology

## 2021-09-29 DIAGNOSIS — D509 Iron deficiency anemia, unspecified: Secondary | ICD-10-CM

## 2021-09-30 ENCOUNTER — Telehealth: Payer: Self-pay | Admitting: Hematology and Oncology

## 2021-09-30 NOTE — Telephone Encounter (Signed)
Scheduled appt per 5/24 referral. Pt is aware of appt date and time. Pt is aware to arrive 15 mins prior to appt time and to bring and updated insurance card. Pt is aware of appt location.   

## 2021-10-10 ENCOUNTER — Other Ambulatory Visit: Payer: Self-pay

## 2021-10-10 ENCOUNTER — Inpatient Hospital Stay: Payer: 59

## 2021-10-10 ENCOUNTER — Encounter: Payer: Self-pay | Admitting: Hematology and Oncology

## 2021-10-10 ENCOUNTER — Ambulatory Visit: Payer: 59

## 2021-10-10 ENCOUNTER — Inpatient Hospital Stay: Payer: 59 | Attending: Hematology and Oncology | Admitting: Hematology and Oncology

## 2021-10-10 VITALS — BP 119/44 | HR 87 | Temp 97.7°F | Resp 18 | Ht 65.0 in | Wt 155.8 lb

## 2021-10-10 DIAGNOSIS — R4689 Other symptoms and signs involving appearance and behavior: Secondary | ICD-10-CM | POA: Diagnosis not present

## 2021-10-10 DIAGNOSIS — Z91199 Patient's noncompliance with other medical treatment and regimen due to unspecified reason: Secondary | ICD-10-CM | POA: Insufficient documentation

## 2021-10-10 DIAGNOSIS — F5089 Other specified eating disorder: Secondary | ICD-10-CM | POA: Insufficient documentation

## 2021-10-10 DIAGNOSIS — D509 Iron deficiency anemia, unspecified: Secondary | ICD-10-CM | POA: Diagnosis not present

## 2021-10-10 DIAGNOSIS — D500 Iron deficiency anemia secondary to blood loss (chronic): Secondary | ICD-10-CM | POA: Insufficient documentation

## 2021-10-10 LAB — IRON AND IRON BINDING CAPACITY (CC-WL,HP ONLY)
Iron: 11 ug/dL — ABNORMAL LOW (ref 28–170)
Saturation Ratios: 2 % — ABNORMAL LOW (ref 10.4–31.8)
TIBC: 500 ug/dL — ABNORMAL HIGH (ref 250–450)
UIBC: 489 ug/dL — ABNORMAL HIGH (ref 148–442)

## 2021-10-10 LAB — CBC WITH DIFFERENTIAL (CANCER CENTER ONLY)
Abs Immature Granulocytes: 0 10*3/uL (ref 0.00–0.07)
Basophils Absolute: 0 10*3/uL (ref 0.0–0.1)
Basophils Relative: 1 %
Eosinophils Absolute: 0.1 10*3/uL (ref 0.0–0.5)
Eosinophils Relative: 3 %
HCT: 23.7 % — ABNORMAL LOW (ref 36.0–46.0)
Hemoglobin: 6.1 g/dL — CL (ref 12.0–15.0)
Immature Granulocytes: 0 %
Lymphocytes Relative: 38 %
Lymphs Abs: 1.2 10*3/uL (ref 0.7–4.0)
MCH: 14.8 pg — ABNORMAL LOW (ref 26.0–34.0)
MCHC: 25.7 g/dL — ABNORMAL LOW (ref 30.0–36.0)
MCV: 57.5 fL — ABNORMAL LOW (ref 80.0–100.0)
Monocytes Absolute: 0.3 10*3/uL (ref 0.1–1.0)
Monocytes Relative: 11 %
Neutro Abs: 1.5 10*3/uL — ABNORMAL LOW (ref 1.7–7.7)
Neutrophils Relative %: 47 %
Platelet Count: 431 10*3/uL — ABNORMAL HIGH (ref 150–400)
RBC: 4.12 MIL/uL (ref 3.87–5.11)
RDW: 22.6 % — ABNORMAL HIGH (ref 11.5–15.5)
WBC Count: 3.1 10*3/uL — ABNORMAL LOW (ref 4.0–10.5)
nRBC: 0 % (ref 0.0–0.2)

## 2021-10-10 LAB — PREPARE RBC (CROSSMATCH)

## 2021-10-10 LAB — VITAMIN B12: Vitamin B-12: 363 pg/mL (ref 180–914)

## 2021-10-10 LAB — SAMPLE TO BLOOD BANK

## 2021-10-10 LAB — FERRITIN: Ferritin: 2 ng/mL — ABNORMAL LOW (ref 11–307)

## 2021-10-10 NOTE — Assessment & Plan Note (Signed)
She has chronic iron deficiency anemia all her life Despite being on oral iron supplement previously, she had no response to treatment She has received blood transfusions along with intravenous iron infusion in the hospital She was noncompliant; after 1 dose of iron Feraheme, she subsequently no-show for multiple appointments Today, she is not symptomatic from her anemia We discussed long-term risk to her health with persistent untreated anemia and ultimately, she agreed for blood transfusion and IV iron We discussed some of the risks, benefits, and alternatives of blood transfusions. The patient is symptomatic from anemia and the hemoglobin level is critically low.  Some of the side-effects to be expected including risks of transfusion reactions, chills, infection, syndrome of volume overload and risk of hospitalization from various reasons and the patient is willing to proceed and went ahead to sign consent today. We will schedule I unit of blood tomorrow  The most likely cause of her anemia is due to chronic blood loss/malabsorption syndrome. We discussed some of the risks, benefits, and alternatives of intravenous iron infusions. The patient is symptomatic from anemia and the iron level is critically low. She tolerated oral iron supplement poorly and desires to achieved higher levels of iron faster for adequate hematopoesis. Some of the side-effects to be expected including risks of infusion reactions, phlebitis, headaches, nausea and fatigue.  The patient is willing to proceed. Patient education material was dispensed.  Goal is to keep ferritin level greater than 50 and resolution of anemia Due to reaction to IV Feraheme, I will prescribe IV sucrose with premedication She would need at least three doses of IV iron for adequate replenishment of iron supply I plan to see her within 1 month after last dose of IV iron to recheck her blood count and iron studies and to determine whether she would need  further IV iron infusion

## 2021-10-10 NOTE — Progress Notes (Signed)
[facility] OFFICE PROGRESS NOTE  Dulce Sellar, NP  ASSESSMENT & PLAN:  Chronic iron deficiency anemia She has chronic iron deficiency anemia all her life Despite being on oral iron supplement previously, she had no response to treatment She has received blood transfusions along with intravenous iron infusion in the hospital She was noncompliant; after 1 dose of iron Feraheme, she subsequently no-show for multiple appointments Today, she is not symptomatic from her anemia We discussed long-term risk to her health with persistent untreated anemia and ultimately, she agreed for blood transfusion and IV iron We discussed some of the risks, benefits, and alternatives of blood transfusions. The patient is symptomatic from anemia and the hemoglobin level is critically low.  Some of the side-effects to be expected including risks of transfusion reactions, chills, infection, syndrome of volume overload and risk of hospitalization from various reasons and the patient is willing to proceed and went ahead to sign consent today. We will schedule I unit of blood tomorrow  The most likely cause of her anemia is due to chronic blood loss/malabsorption syndrome. We discussed some of the risks, benefits, and alternatives of intravenous iron infusions. The patient is symptomatic from anemia and the iron level is critically low. She tolerated oral iron supplement poorly and desires to achieved higher levels of iron faster for adequate hematopoesis. Some of the side-effects to be expected including risks of infusion reactions, phlebitis, headaches, nausea and fatigue.  The patient is willing to proceed. Patient education material was dispensed.  Goal is to keep ferritin level greater than 50 and resolution of anemia Due to reaction to IV Feraheme, I will prescribe IV sucrose with premedication She would need at least three doses of IV iron for adequate replenishment of iron supply I plan to  see her within 1 month after last dose of IV iron to recheck her blood count and iron studies and to determine whether she would need further IV iron infusion   Non-compliant behavior She has history of noncompliance and multiple no-shows to her appointment We will try to accommodate her work schedule by scheduling infusion treatment before her work schedule  Orders Placed This Encounter  Procedures   CBC with Differential/Platelet    Standing Status:   Standing    Number of Occurrences:   22    Standing Expiration Date:   10/11/2022   Care order/instruction    Transfuse Parameters    Standing Status:   Future    Standing Expiration Date:   10/10/2022   Informed Consent Details: Physician/Practitioner Attestation; Transcribe to consent form and obtain patient signature    Standing Status:   Future    Standing Expiration Date:   10/11/2022    Order Specific Question:   Physician/Practitioner attestation of informed consent for blood and or blood product transfusion    Answer:   I, the physician/practitioner, attest that I have discussed with the patient the benefits, risks, side effects, alternatives, likelihood of achieving goals and potential problems during recovery for the procedure that I have provided informed consent.    Order Specific Question:   Product(s)    Answer:   All Product(s)   Type and screen         Standing Status:   Future    Number of Occurrences:   1    Standing Expiration Date:   10/11/2022   Prepare RBC (crossmatch)    Standing Status:   Standing    Number of Occurrences:  1    Order Specific Question:   # of Units    Answer:   1 unit    Order Specific Question:   Transfusion Indications    Answer:   Symptomatic Anemia    Order Specific Question:   Number of Units to Keep Ahead    Answer:   NO units ahead    Order Specific Question:   Instructions:    Answer:   Transfuse    Order Specific Question:   If emergent release call blood bank    Answer:   Not  emergent release    The total time spent in the appointment was 40 minutes encounter with patients including review of chart and various tests results, discussions about plan of care and coordination of care plan   All questions were answered. The patient knows to call the clinic with any problems, questions or concerns. No barriers to learning was detected.    Artis DelayNi Samanyu Tinnell, MD 6/5/202310:17 AM  INTERVAL HISTORY: Teresa Middleton 26 y.o. female returns for urgent evaluation for severe anemia The patient has history of noncompliance She was set up for IV iron sucrose but we were not able to get hold of her subsequently She was found to be profoundly anemic today She denies signs and symptoms of anemia such as chest pain, shortness of breath or dizziness She denies excessive menorrhagia She denies taking NSAID or aspirin  SUMMARY OF HEMATOLOGIC HISTORY: The patient told me she has been anemic all her life In high school, she was found to have hemoglobin of four and was prescribed oral iron supplement without improvement Most recently, she had recurrent ER visits, initially for epistaxis and then subsequently due to significant symptoms of anemia The patient is noted to have nose ring piercing on the medial aspect of both sides of her nasal passage She denies a nose ring piercing as a cause of her epistaxis  In the emergency room, she was found to be profoundly anemic with hemoglobin of 5.8 Further testing revealed iron deficiency anemia In addition to receiving blood, she received one dose of IV Feraheme, complicated by hives She still complain of excessive fatigue, shortness of breath on minimal exertion, pre-syncopal episodes, and palpitations. She had not noticed any recent hematuria or hematochezia The patient denies over the counter NSAID ingestion. She is not on antiplatelets agents.  She had no prior history or diagnosis of cancer. Her age appropriate screening programs are  up-to-date. She has pica with ice craving but eats a variety of diet. She never donated blood The patient was prescribed oral iron supplements but the iron supplement did not help She had received blood transfusion and IV iron in 2022, complete take it by slight reaction to IV Feraheme with hives  I have reviewed the past medical history, past surgical history, social history and family history with the patient and they are unchanged from previous note.  ALLERGIES:  has No Known Allergies.  MEDICATIONS:  Current Outpatient Medications  Medication Sig Dispense Refill   albuterol (VENTOLIN HFA) 108 (90 Base) MCG/ACT inhaler Inhale 2 puffs into the lungs every 6 (six) hours as needed for wheezing or shortness of breath. 8 g 1   amphetamine-dextroamphetamine (ADDERALL) 20 MG tablet Take 1 tablet (20 mg total) by mouth daily before breakfast. 30 tablet 0   amphetamine-dextroamphetamine (ADDERALL) 20 MG tablet Take 1 tablet (20 mg total) by mouth daily before breakfast. 30 tablet 0   amphetamine-dextroamphetamine (ADDERALL) 20 MG tablet Take 1 tablet (  20 mg total) by mouth daily before breakfast. 30 tablet 0   amphetamine-dextroamphetamine (ADDERALL) 20 MG tablet Take 1 tablet (20 mg total) by mouth daily before breakfast. 30 tablet 0   [START ON 10/27/2021] amphetamine-dextroamphetamine (ADDERALL) 20 MG tablet Take 1 tablet (20 mg total) by mouth daily before breakfast. 30 tablet 0   [START ON 11/26/2021] amphetamine-dextroamphetamine (ADDERALL) 20 MG tablet Take 1 tablet (20 mg total) by mouth daily before breakfast. 30 tablet 0   ferrous sulfate 324 MG TBEC Take 1 tablet (324 mg total) by mouth daily. 30 tablet 5   No current facility-administered medications for this visit.     REVIEW OF SYSTEMS:   Constitutional: Denies fevers, chills or night sweats Eyes: Denies blurriness of vision Ears, nose, mouth, throat, and face: Denies mucositis or sore throat Respiratory: Denies cough, dyspnea or  wheezes Cardiovascular: Denies palpitation, chest discomfort or lower extremity swelling Gastrointestinal:  Denies nausea, heartburn or change in bowel habits Skin: Denies abnormal skin rashes Lymphatics: Denies new lymphadenopathy or easy bruising Neurological:Denies numbness, tingling or new weaknesses Behavioral/Psych: Mood is stable, no new changes  All other systems were reviewed with the patient and are negative.  PHYSICAL EXAMINATION: ECOG PERFORMANCE STATUS: 0 - Asymptomatic  Vitals:   10/10/21 0831  BP: (!) 119/44  Pulse: 87  Resp: 18  Temp: 97.7 F (36.5 C)  SpO2: 100%   Filed Weights   10/10/21 0831  Weight: 155 lb 12.8 oz (70.7 kg)    GENERAL:alert, no distress and comfortable SKIN: skin color, texture, turgor are normal, no rashes or significant lesions EYES: normal, Conjunctiva are pink and non-injected, sclera clear OROPHARYNX:no exudate, no erythema and lips, buccal mucosa, and tongue normal  NECK: supple, thyroid normal size, non-tender, without nodularity LYMPH:  no palpable lymphadenopathy in the cervical, axillary or inguinal LUNGS: clear to auscultation and percussion with normal breathing effort HEART: regular rate & rhythm and no murmurs and no lower extremity edema ABDOMEN:abdomen soft, non-tender and normal bowel sounds Musculoskeletal:no cyanosis of digits and no clubbing  NEURO: alert & oriented x 3 with fluent speech, no focal motor/sensory deficits  LABORATORY DATA:  I have reviewed the data as listed     Component Value Date/Time   NA 138 06/10/2021 0043   K 3.6 06/10/2021 0043   CL 109 06/10/2021 0043   CO2 24 06/10/2021 0043   GLUCOSE 95 06/10/2021 0043   BUN 9 06/10/2021 0043   CREATININE 0.83 06/10/2021 0043   CALCIUM 9.3 06/10/2021 0043   PROT 7.3 10/05/2020 1152   ALBUMIN 3.9 10/05/2020 1152   AST 22 10/05/2020 1152   ALT 17 10/05/2020 1152   ALKPHOS 59 10/05/2020 1152   BILITOT 0.4 10/05/2020 1152   GFRNONAA >60 06/10/2021  0043    No results found for: SPEP, UPEP  Lab Results  Component Value Date   WBC 3.1 (L) 10/10/2021   NEUTROABS 1.5 (L) 10/10/2021   HGB 6.1 (LL) 10/10/2021   HCT 23.7 (L) 10/10/2021   MCV 57.5 (L) 10/10/2021   PLT 431 (H) 10/10/2021      Chemistry      Component Value Date/Time   NA 138 06/10/2021 0043   K 3.6 06/10/2021 0043   CL 109 06/10/2021 0043   CO2 24 06/10/2021 0043   BUN 9 06/10/2021 0043   CREATININE 0.83 06/10/2021 0043      Component Value Date/Time   CALCIUM 9.3 06/10/2021 0043   ALKPHOS 59 10/05/2020 1152   AST 22 10/05/2020  1152   ALT 17 10/05/2020 1152   BILITOT 0.4 10/05/2020 1152

## 2021-10-10 NOTE — Progress Notes (Signed)
CRITICAL VALUE STICKER  CRITICAL VALUE: Hgb 6.1  RECEIVER (on-site recipient of call): Tilford Pillar, RN  DATE & TIME NOTIFIED: 10/10/21 0824  MESSENGER (representative from lab): Liane Comber.  MD NOTIFIED: Dr. Bertis Ruddy  TIME OF NOTIFICATION: 4854 10/10/21  RESPONSE:  Will see in the office

## 2021-10-10 NOTE — Assessment & Plan Note (Signed)
She has history of noncompliance and multiple no-shows to her appointment We will try to accommodate her work schedule by scheduling infusion treatment before her work schedule

## 2021-10-11 ENCOUNTER — Telehealth: Payer: Self-pay

## 2021-10-11 ENCOUNTER — Inpatient Hospital Stay: Payer: 59

## 2021-10-11 ENCOUNTER — Encounter: Payer: Self-pay | Admitting: Hematology and Oncology

## 2021-10-11 NOTE — Telephone Encounter (Signed)
Called and left a message. She missed/ no showed 0830 infusion appt today. Ask her to call the office back.

## 2021-10-11 NOTE — Telephone Encounter (Signed)
Returned her call. She accidentally slept in today and missed appt. She is concerned about missing work and her job. She does not want to reschedule the blood transfusion at this time.  Given fax # for office, she will fax FMLA paper work or bring by the office to be completed. She will keep appts scheduled for IV Iron. Instructed to call the office back if she changes her mind about scheduling blood transfusion. She verbalized understanding.

## 2021-10-13 ENCOUNTER — Inpatient Hospital Stay: Payer: 59

## 2021-10-13 ENCOUNTER — Ambulatory Visit: Payer: 59 | Admitting: Family

## 2021-10-13 ENCOUNTER — Telehealth: Payer: Self-pay | Admitting: *Deleted

## 2021-10-13 ENCOUNTER — Other Ambulatory Visit: Payer: Self-pay

## 2021-10-13 ENCOUNTER — Encounter: Payer: Self-pay | Admitting: Family

## 2021-10-13 ENCOUNTER — Telehealth: Payer: Self-pay | Admitting: Family

## 2021-10-13 VITALS — BP 99/55 | HR 82 | Temp 98.3°F | Ht 65.0 in | Wt 155.4 lb

## 2021-10-13 DIAGNOSIS — F064 Anxiety disorder due to known physiological condition: Secondary | ICD-10-CM | POA: Diagnosis not present

## 2021-10-13 DIAGNOSIS — D649 Anemia, unspecified: Secondary | ICD-10-CM

## 2021-10-13 DIAGNOSIS — D509 Iron deficiency anemia, unspecified: Secondary | ICD-10-CM

## 2021-10-13 DIAGNOSIS — D500 Iron deficiency anemia secondary to blood loss (chronic): Secondary | ICD-10-CM | POA: Diagnosis not present

## 2021-10-13 HISTORY — DX: Anemia, unspecified: D64.9

## 2021-10-13 MED ORDER — SODIUM CHLORIDE 0.9% IV SOLUTION
250.0000 mL | Freq: Once | INTRAVENOUS | Status: AC
  Administered 2021-10-13: 250 mL via INTRAVENOUS

## 2021-10-13 MED ORDER — ACETAMINOPHEN 325 MG PO TABS
650.0000 mg | ORAL_TABLET | Freq: Once | ORAL | Status: AC
  Administered 2021-10-13: 650 mg via ORAL
  Filled 2021-10-13: qty 2

## 2021-10-13 MED ORDER — DIPHENHYDRAMINE HCL 25 MG PO CAPS
25.0000 mg | ORAL_CAPSULE | Freq: Once | ORAL | Status: AC
  Administered 2021-10-13: 25 mg via ORAL
  Filled 2021-10-13: qty 1

## 2021-10-13 NOTE — Telephone Encounter (Addendum)
Contacted by Virgilio Frees w/  @ [place] [facility]  on behalf of Jeanie Sewer, [place]. Patient seen in their office today.  Ms. Felipe Drone requests/inquiring if patient can receive blood transfusion and IV iron today as patient's Hgb low - 6.1 on 10/10/21.   Per Virgilio Frees, patient still in office at this time, awaiting response on scheduling transfusion.  Virgina Evener that patient missed appt for blood transfusion on 10/11/21 and when contacted, declined to reschedule appt at that time and was instructed to contact Dr.Gorsuch's office if she wanted to reschedule.  Contacted CN in [facility] Rm - patient can be scheduled for transfusion today at 1430 if patient can come at that time.  Contacted [facility] [place] confirmed PRBCs prepared on 10/11/21 are still available.  Patient scheduled for IV Iron weekly x 3, starting 6/17. No appts open for IV iron today at [facility]. Appts may be open on 6/12 if patient available.   Contacted [facility] office - Dejase not available. Patient no longer in office.  Contacted patient. Patient states she still has blue blood bracelet on from  10/10/21 lab test/blood bank sample. Informed her appt made for her today at 1430 for blood transfusion, arrive at 1415 to check in. Patient states she will be here. Patient states she will discuss IV Iron schedule with scheduling staff while she is here today.

## 2021-10-13 NOTE — Progress Notes (Signed)
Subjective:     Patient ID: Teresa Middleton, female    DOB: [birth year], 26 y.o.   MRN: [id number]  Chief Complaint  Patient presents with   Anxiety    Pt c/o anxiety and stress, Pt states she has not been able to go to hematology visits , due to her job not taking doctors notes.    Anemia    Pt c/o headaches, dizziness and fatigue. HGB : 6.1   HPI: Anemia: Patient presents for presents evaluation of anemia. Anemia was found by chronic condition, under care of Hematology, but not seen since last year and required an iron infusion.  It has been present for many years.  Associated signs & symptoms: reports fatigue if Hgb goes down closer to 6 or lower range. Reports she is not taking any iron currently and reports still having heavy cycles, on her cycle currently. Last Hgb 6.1 - seen by [facility] and told to schedule blood transfusion and iron infusion, but pt states her work will not let her off of work to do this. 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  Anxiety/Depression: Patient complains of anxiety disorder.   She has the following symptoms: difficulty concentrating, fatigue, feelings of losing control, irritable, racing thoughts, shortness of breath.  Onset of symptoms was approximately  months ago, She denies current suicidal and homicidal ideation.  Possible organic causes contributing are: none.  Risk factors: negative life event : current job.   Previous treatment includes nothing.  pt reports her current work situation is very bad, no support, can't get off work for serious health problems.    10/13/2021   10:08 AM  Depression screen PHQ 2/9  Decreased Interest 2  Down, Depressed, Hopeless 2  PHQ - 2 Score 4  Altered sleeping 2  Tired, decreased energy 3  Change in appetite 3  Feeling bad or failure about yourself  1  Trouble concentrating 2  Moving slowly or fidgety/restless 0  Suicidal thoughts 0  PHQ-9 Score 15  Difficult doing work/chores Very difficult      10/13/2021    10:07 AM  GAD 7 : Generalized Anxiety Score  Nervous, Anxious, on Edge 3  Control/stop worrying 2  Worry too much - different things 2  Trouble relaxing 1  Restless 1  Easily annoyed or irritable 1  Afraid - awful might happen 2  Total GAD 7 Score 12  Anxiety Difficulty Extremely difficult     Assessment & Plan:   Problem List Items Addressed This Visit       Nervous and Auditory   Anxiety disorder due to known physiological condition    New - unstable -  pt reports her current work situation is very bad, no support, can't get off work for serious health problems. Pt to get FMLA forms for me to sign. Does not want meds to take but interested in therapy. Sending urgent referral to [facility], but pt also made aware of local mental health urgent care on [address] for walk-in care. f/u in 1 month.      Relevant Orders   Ambulatory referral to Psychology     Other   Acute on chronic anemia - Primary    Last Hgb 6.1 - seen by [facility] and told to schedule blood transfusion and iron infusion, but pt states her work will not let her off of work to do this. 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  Advised pt to get me the [facility] asap  and I will complete. Also calling [facility]/infusion lab to see hours if pt can get worked in.         Outpatient Medications Prior to Visit  Medication Sig Dispense Refill   albuterol (VENTOLIN HFA) 108 (90 Base) MCG/ACT inhaler Inhale 2 puffs into the lungs every 6 (six) hours as needed for wheezing or shortness of breath. 8 g 1   amphetamine-dextroamphetamine (ADDERALL) 20 MG tablet Take 1 tablet (20 mg total) by mouth daily before breakfast. 30 tablet 0   [START ON 10/27/2021] amphetamine-dextroamphetamine (ADDERALL) 20 MG tablet Take 1 tablet (20 mg total) by mouth daily before breakfast. 30 tablet 0   [START ON 11/26/2021] amphetamine-dextroamphetamine (ADDERALL) 20 MG tablet Take 1 tablet (20 mg total) by mouth daily before breakfast. 30 tablet 0   ferrous sulfate  324 MG TBEC Take 1 tablet (324 mg total) by mouth daily. 30 tablet 5   amphetamine-dextroamphetamine (ADDERALL) 20 MG tablet Take 1 tablet (20 mg total) by mouth daily before breakfast. 30 tablet 0   amphetamine-dextroamphetamine (ADDERALL) 20 MG tablet Take 1 tablet (20 mg total) by mouth daily before breakfast. 30 tablet 0   amphetamine-dextroamphetamine (ADDERALL) 20 MG tablet Take 1 tablet (20 mg total) by mouth daily before breakfast. 30 tablet 0   No facility-administered medications prior to visit.    Past Medical History:  Diagnosis Date   ADD (attention deficit disorder)    Anemia     No past surgical history on file.  Allergies  Allergen Reactions   Feraheme [Ferumoxytol] Hives       Objective:    Physical Exam Vitals and nursing note reviewed.  Constitutional:      Appearance: Normal appearance.  Cardiovascular:     Rate and Rhythm: Normal rate and regular rhythm.  Pulmonary:     Effort: Pulmonary effort is normal.     Breath sounds: Normal breath sounds.  Musculoskeletal:        General: Normal range of motion.  Skin:    General: Skin is warm and dry.     Coloration: Skin is pale.  Neurological:     Mental Status: She is lethargic.  Psychiatric:        Mood and Affect: Mood is anxious and depressed. Affect is tearful.        Behavior: Behavior normal.     BP (!) 99/55 (BP Location: Left Arm, Patient Position: Sitting, Cuff Size: Large)   Pulse 82   Temp 98.3 F (36.8 C) (Temporal)   Ht 5\' 5"  (1.651 m)   Wt 155 lb 6 oz (70.5 kg)   LMP 09/23/2021 (Exact Date)   SpO2 95%   BMI 25.86 kg/m  Wt Readings from Last 3 Encounters:  10/13/21 155 lb 6 oz (70.5 kg)  10/10/21 155 lb 12.8 oz (70.7 kg)  09/27/21 155 lb (70.3 kg)   *Extra time (09/29/21) spent with patient today which consisted of chart review, discussing diagnoses - new & unstable anxiety/depression, work up including calling Hematology to schedule infusions, long discussion on importance of  treatment, answering questions, and documentation.   , NP

## 2021-10-13 NOTE — Assessment & Plan Note (Signed)
New - unstable -  pt reports her current work situation is very bad, no support, can't get off work for serious health problems. Pt to get FMLA forms for me to sign. Does not want meds to take but interested in therapy. Sending urgent referral to [facility], but pt also made aware of local mental health urgent care on [address] for walk-in care. f/u in 1 month.

## 2021-10-13 NOTE — Assessment & Plan Note (Signed)
Last Hgb 6.1 - seen by [facility] and told to schedule blood transfusion and iron infusion, but pt states her work will not let her off of work to do this. 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  Advised pt to get me the FMLA asap and I will complete. Also calling [facility]/infusion lab to see hours if pt can get worked in.

## 2021-10-13 NOTE — Patient Instructions (Signed)
It was very nice to see you today!   Remember to go ahead and bring by FMLA forms for me to go ahead & sign before you find a new job.  You have to get in for your blood transfusion and iron infusion - these are life sustaining treatments! You may feel fine today but tomorrow be passed out!  I have sent a referral to our therapy office. BUT you can also go to the [facility] as a walk-in located at [facility] here in [place] - off [address]. This is our emergency outpatient center.      PLEASE NOTE:  If you had any lab tests please let us know if you have not heard back within a few days. You may see your results on MyChart before we have a chance to review them but we will give you a call once they are reviewed by [place]. If we ordered any referrals today, please let us know if you have not heard from their office within the next week.

## 2021-10-13 NOTE — Telephone Encounter (Signed)
Sandy from Dr. Maxine Glenn office at the [facility] called back  in regards to pt's need for a blood transfusion. States she was calling to disclose details to [facility] of an opening they have today. Unfortunately Pt has left [facility] following her visit with Dulce Sellar on 10/13/20. Due to this Andrey Campanile stated she will attempt to call the pt and make her aware of said appointment.  Despite this she would still like a callback at [phone] to disclose important details.

## 2021-10-13 NOTE — Telephone Encounter (Signed)
I Called and spoke with pt. Pt states she was on the way to Hematology for her blood transfusion. I let pt know Judeth Cornfield highly recommends her to getting her Iron infusion Saturday 6/10. Pt stated she will let us know.

## 2021-10-14 LAB — BPAM RBC
Blood Product Expiration Date: 202307032359
ISSUE DATE / TIME: 202306081517
Unit Type and Rh: 5100

## 2021-10-14 LAB — TYPE AND SCREEN
ABO/RH(D): O POS
Antibody Screen: NEGATIVE
Unit division: 0

## 2021-10-17 ENCOUNTER — Telehealth: Payer: Self-pay | Admitting: Family

## 2021-10-17 ENCOUNTER — Encounter: Payer: Self-pay | Admitting: Hematology and Oncology

## 2021-10-17 NOTE — Telephone Encounter (Signed)
.  Type of form received: Short Term Disability  Additional comments:   Received by: Adonis Brook  Form should be Faxed to: [phone]  Form should be mailed to:    Is patient requesting call for pickup:   Form placed:  In provider's box  Attach charge sheet. yes  Individual made aware of 3-5 business day turn around (Y/N)?

## 2021-10-21 MED FILL — Iron Sucrose Inj 20 MG/ML (Fe Equiv): INTRAVENOUS | Qty: 20 | Status: AC

## 2021-10-22 ENCOUNTER — Telehealth: Payer: Self-pay

## 2021-10-22 ENCOUNTER — Inpatient Hospital Stay: Payer: 59

## 2021-10-22 NOTE — Telephone Encounter (Signed)
Called patient regarding her missed infusion appointment today 10/22/21. Patient informed she was sick and tried to reach out to [place], but was unable to get through. I informed the patient that I will have scheduling reach out to her to get this appointment rescheduled. Patient had no questions or concerns.

## 2021-10-25 ENCOUNTER — Ambulatory Visit (INDEPENDENT_AMBULATORY_CARE_PROVIDER_SITE_OTHER): Payer: 59 | Admitting: Psychology

## 2021-10-25 DIAGNOSIS — F4323 Adjustment disorder with mixed anxiety and depressed mood: Secondary | ICD-10-CM | POA: Diagnosis not present
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
## 2021-10-26 ENCOUNTER — Other Ambulatory Visit: Payer: Self-pay | Admitting: Family

## 2021-10-27 ENCOUNTER — Ambulatory Visit: Payer: 59 | Admitting: Family

## 2021-10-27 ENCOUNTER — Encounter: Payer: Self-pay | Admitting: Family

## 2021-10-27 VITALS — BP 117/76 | HR 81 | Temp 98.0°F | Ht 65.0 in | Wt 155.1 lb

## 2021-10-27 DIAGNOSIS — F064 Anxiety disorder due to known physiological condition: Secondary | ICD-10-CM

## 2021-10-27 DIAGNOSIS — D649 Anemia, unspecified: Secondary | ICD-10-CM

## 2021-10-27 NOTE — Assessment & Plan Note (Signed)
Doing better, patient had first session with therapist, reports it went well, we will see again in 2 weeks.  Patient also has been out of work for the last couple of weeks which has decreased her stress level.  Encouraged continued therapy visits, and to let me know if medication intervention is needed.

## 2021-10-27 NOTE — Patient Instructions (Addendum)
It was very nice to see you today!   We have faxed over your FMLA paperwork. I listed 11/09/2021 as a return date.  DO NOT MISS YOUR INFUSION APPOINTMENT!  When you start your menstrual cycle, take up to 800mg  of Ibuprofen 3 times per day (eat something first) while you are bleeding to decrease your bleeding.  Increase iron in your diet as much as possible.  Continue with your therapy sessions, take advantage of this time off from work and take care of YOU! Rest, eat well, hydrate with 2 liters water daily, practice yoga, get outside and walk, or hike, enjoy the fresh air (when not raining. Marland Kitchen!)    PLEASE NOTE:  If you had any lab tests please let Marland Kitchen know if you have not heard back within a few days. You may see your results on MyChart before we have a chance to review them but we will give you a call once they are reviewed by [place]. If we ordered any referrals today, please let us know if you have not heard from their office within the next week.

## 2021-10-27 NOTE — Progress Notes (Signed)
Subjective:     Patient ID: Teresa Middleton, female    DOB: Jun 18, 1995, 26 y.o.   MRN: [id number]  Chief Complaint  Patient presents with   Follow-up    Pt c/o stress/anemia Pt stated she is having some hair loss in the back of her head and her cycle is late (10 days), Dizziness and fatigue. Has her Iron infusion on 6/24.     HPI: Anemia: Patient presents for presents evaluation of anemia. Anemia was found by chronic condition, under care of Hematology, but not seen since last year and required an iron infusion.  It has been present for many years.  Associated signs & symptoms: reports fatigue if Hgb goes down closer to 6 or lower range. Reports she is not taking any iron currently and reports still having heavy cycles, on her cycle currently. Last Hgb 6.1 - seen by HEME and told to schedule blood transfusion and iron infusion, but pt states her work will not let her off of work to do this. 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  Follow-up with Anxiety/Depression: Patient complains of anxiety disorder.   She has the following symptoms: difficulty concentrating, fatigue, feelings of losing control, irritable, racing thoughts, shortness of breath.  Onset of symptoms was approximately  months ago, She denies current suicidal and homicidal ideation. Possible organic causes contributing are: none. Risk factors: negative life event : current job. Previous treatment includes nothing.  pt reports her current work situation is very bad, no support, can't get off work for serious health problems. Reports seeing therapist last week which was helpful, will see again in 2 weeks.   Assessment & Plan:   Problem List Items Addressed This Visit       Nervous and Auditory   Anxiety disorder due to known physiological condition    Doing better, patient had first session with therapist, reports it went well, we will see again in 2 weeks.  Patient also has been out of work for the last couple of weeks which has  decreased her stress level.  Encouraged continued therapy visits, and to let me know if medication intervention is needed.        Other   Acute on chronic anemia - Primary    FMLA forms completed and faxed today for patient, reports she has been out of work for the last 2 weeks, still has not had her iron infusions yet, reinforced importance of getting this done ASAP, designated July 5 as a return to work date on her form, as this should be enough time to have received the infusion and to be feeling better.  Follow-up as needed.       Outpatient Medications Prior to Visit  Medication Sig Dispense Refill   albuterol (VENTOLIN HFA) 108 (90 Base) MCG/ACT inhaler Inhale 2 puffs into the lungs every 6 (six) hours as needed for wheezing or shortness of breath. 8 g 1   amphetamine-dextroamphetamine (ADDERALL) 20 MG tablet Take 1 tablet (20 mg total) by mouth daily before breakfast. 30 tablet 0   [START ON 11/26/2021] amphetamine-dextroamphetamine (ADDERALL) 20 MG tablet Take 1 tablet (20 mg total) by mouth daily before breakfast. 30 tablet 0   ferrous sulfate 324 MG TBEC Take 1 tablet (324 mg total) by mouth daily. 30 tablet 5   amphetamine-dextroamphetamine (ADDERALL) 20 MG tablet Take 1 tablet (20 mg total) by mouth daily before breakfast. 30 tablet 0   amphetamine-dextroamphetamine (ADDERALL) 20 MG tablet Take 1 tablet (20 mg  total) by mouth daily before breakfast. 30 tablet 0   amphetamine-dextroamphetamine (ADDERALL) 20 MG tablet Take 1 tablet (20 mg total) by mouth daily before breakfast. 30 tablet 0   amphetamine-dextroamphetamine (ADDERALL) 20 MG tablet Take 1 tablet (20 mg total) by mouth daily before breakfast. 30 tablet 0   No facility-administered medications prior to visit.    Past Medical History:  Diagnosis Date   ADD (attention deficit disorder)    Anemia     No past surgical history on file.  Allergies  Allergen Reactions   Feraheme [Ferumoxytol] Hives       Objective:     Physical Exam Vitals and nursing note reviewed.  Constitutional:      Appearance: Normal appearance.  Cardiovascular:     Rate and Rhythm: Normal rate and regular rhythm.  Pulmonary:     Effort: Pulmonary effort is normal.     Breath sounds: Normal breath sounds.  Musculoskeletal:        General: Normal range of motion.  Skin:    General: Skin is warm and dry.  Neurological:     Mental Status: She is alert.  Psychiatric:        Mood and Affect: Mood normal.        Behavior: Behavior normal.     BP 117/76 (BP Location: Left Arm, Patient Position: Sitting, Cuff Size: Large)   Pulse 81   Temp 98 F (36.7 C) (Temporal)   Ht 5\' 5"  (1.651 m)   Wt 155 lb 2 oz (70.4 kg)   LMP 09/23/2021 (Exact Date)   SpO2 94%   BMI 25.81 kg/m  Wt Readings from Last 3 Encounters:  10/27/21 155 lb 2 oz (70.4 kg)  10/13/21 155 lb 6 oz (70.5 kg)  10/10/21 155 lb 12.8 oz (70.7 kg)    *Extra time (12/10/21) spent with patient today which consisted of chart review, discussing diagnoses, completing FMLA paperwork, answering questions, and documentation.   , NP

## 2021-10-27 NOTE — Assessment & Plan Note (Signed)
FMLA forms completed and faxed today for patient, reports she has been out of work for the last 2 weeks, still has not had her iron infusions yet, reinforced importance of getting this done ASAP, designated July 5 as a return to work date on her form, as this should be enough time to have received the infusion and to be feeling better.  Follow-up as needed.

## 2021-10-29 ENCOUNTER — Emergency Department (HOSPITAL_COMMUNITY)
Admission: EM | Admit: 2021-10-29 | Discharge: 2021-10-29 | Disposition: A | Discharge status: Home / Self Care | Payer: 59 | Attending: Emergency Medicine | Admitting: Emergency Medicine

## 2021-10-29 ENCOUNTER — Emergency Department (HOSPITAL_COMMUNITY): Payer: 59

## 2021-10-29 ENCOUNTER — Inpatient Hospital Stay: Payer: 59

## 2021-10-29 ENCOUNTER — Other Ambulatory Visit: Payer: Self-pay

## 2021-10-29 ENCOUNTER — Encounter (HOSPITAL_COMMUNITY): Payer: Self-pay

## 2021-10-29 VITALS — BP 117/62 | HR 77 | Temp 97.7°F | Resp 18

## 2021-10-29 DIAGNOSIS — R1032 Left lower quadrant pain: Secondary | ICD-10-CM | POA: Insufficient documentation

## 2021-10-29 DIAGNOSIS — N938 Other specified abnormal uterine and vaginal bleeding: Secondary | ICD-10-CM | POA: Insufficient documentation

## 2021-10-29 DIAGNOSIS — T7840XA Allergy, unspecified, initial encounter: Secondary | ICD-10-CM | POA: Insufficient documentation

## 2021-10-29 DIAGNOSIS — D509 Iron deficiency anemia, unspecified: Secondary | ICD-10-CM

## 2021-10-29 DIAGNOSIS — R109 Unspecified abdominal pain: Secondary | ICD-10-CM

## 2021-10-29 LAB — CBC WITH DIFFERENTIAL/PLATELET
Abs Immature Granulocytes: 0.07 10*3/uL (ref 0.00–0.07)
Basophils Absolute: 0 10*3/uL (ref 0.0–0.1)
Basophils Relative: 0 %
Eosinophils Absolute: 0 10*3/uL (ref 0.0–0.5)
Eosinophils Relative: 0 %
HCT: 45.5 % (ref 36.0–46.0)
Hemoglobin: 12.2 g/dL (ref 12.0–15.0)
Immature Granulocytes: 1 %
Lymphocytes Relative: 7 %
Lymphs Abs: 0.7 10*3/uL (ref 0.7–4.0)
MCH: 18 pg — ABNORMAL LOW (ref 26.0–34.0)
MCHC: 26.8 g/dL — ABNORMAL LOW (ref 30.0–36.0)
MCV: 67.3 fL — ABNORMAL LOW (ref 80.0–100.0)
Monocytes Absolute: 0.1 10*3/uL (ref 0.1–1.0)
Monocytes Relative: 1 %
Neutro Abs: 9.4 10*3/uL — ABNORMAL HIGH (ref 1.7–7.7)
Neutrophils Relative %: 91 %
Platelets: 422 10*3/uL — ABNORMAL HIGH (ref 150–400)
RBC: 6.76 MIL/uL — ABNORMAL HIGH (ref 3.87–5.11)
RDW: 31.6 % — ABNORMAL HIGH (ref 11.5–15.5)
WBC: 10.3 10*3/uL (ref 4.0–10.5)
nRBC: 0 % (ref 0.0–0.2)

## 2021-10-29 LAB — COMPREHENSIVE METABOLIC PANEL
ALT: 18 U/L (ref 0–44)
AST: 28 U/L (ref 15–41)
Albumin: 3.9 g/dL (ref 3.5–5.0)
Alkaline Phosphatase: 48 U/L (ref 38–126)
Anion gap: 8 (ref 5–15)
BUN: 9 mg/dL (ref 6–20)
CO2: 17 mmol/L — ABNORMAL LOW (ref 22–32)
Calcium: 9.3 mg/dL (ref 8.9–10.3)
Chloride: 114 mmol/L — ABNORMAL HIGH (ref 98–111)
Creatinine, Ser: 0.87 mg/dL (ref 0.44–1.00)
GFR, Estimated: >60 mL/min (ref >60)
Glucose, Bld: 109 mg/dL — ABNORMAL HIGH (ref 70–99)
Potassium: 4.7 mmol/L (ref 3.5–5.1)
Sodium: 139 mmol/L (ref 135–145)
Total Bilirubin: 0.2 mg/dL — ABNORMAL LOW (ref 0.3–1.2)
Total Protein: 7 g/dL (ref 6.5–8.1)

## 2021-10-29 LAB — URINALYSIS, ROUTINE W REFLEX MICROSCOPIC
Bilirubin Urine: NEGATIVE
Glucose, UA: NEGATIVE mg/dL
Hgb urine dipstick: NEGATIVE
Ketones, ur: NEGATIVE mg/dL
Leukocytes,Ua: NEGATIVE
Nitrite: NEGATIVE
Protein, ur: NEGATIVE mg/dL
Specific Gravity, Urine: 1.015 (ref 1.005–1.030)
pH: 6 (ref 5.0–8.0)

## 2021-10-29 LAB — LIPASE, BLOOD: Lipase: 59 U/L — ABNORMAL HIGH (ref 11–51)

## 2021-10-29 LAB — PREGNANCY, URINE: Preg Test, Ur: NEGATIVE

## 2021-10-29 MED ORDER — METHYLPREDNISOLONE SODIUM SUCC 40 MG IJ SOLR
40.0000 mg | Freq: Once | INTRAMUSCULAR | Status: AC
  Administered 2021-10-29: 40 mg via INTRAVENOUS
  Filled 2021-10-29: qty 1

## 2021-10-29 MED ORDER — CETIRIZINE HCL 10 MG PO TABS: 10.0000 mg | ORAL_TABLET | Freq: Every day | ORAL | 0 refills | Status: DC

## 2021-10-29 MED ORDER — ONDANSETRON HCL 4 MG/2ML IJ SOLN
4.0000 mg | Freq: Once | INTRAMUSCULAR | Status: AC
Start: 2021-10-29 — End: 2021-10-29
  Administered 2021-10-29: 4 mg via INTRAVENOUS
  Filled 2021-10-29: qty 2

## 2021-10-29 MED ORDER — DICYCLOMINE HCL 20 MG PO TABS: 20.0000 mg | ORAL_TABLET | Freq: Two times a day (BID) | ORAL | 0 refills | Status: DC | PRN

## 2021-10-29 MED ORDER — DIPHENHYDRAMINE HCL 50 MG/ML IJ SOLN
INTRAMUSCULAR | Status: AC
  Administered 2021-10-29: 50 mg via INTRAVENOUS
  Filled 2021-10-29: qty 1

## 2021-10-29 MED ORDER — SUCRALFATE 1 G PO TABS: 1.0000 g | ORAL_TABLET | Freq: Three times a day (TID) | ORAL | 0 refills | Status: DC

## 2021-10-29 MED ORDER — PREDNISONE 10 MG (21) PO TBPK: ORAL_TABLET | Freq: Every day | ORAL | 0 refills | Status: DC

## 2021-10-29 MED ORDER — HYDROMORPHONE HCL 1 MG/ML IJ SOLN
1.0000 mg | Freq: Once | INTRAMUSCULAR | Status: AC
  Administered 2021-10-29: 1 mg via INTRAVENOUS
  Filled 2021-10-29: qty 1

## 2021-10-29 MED ORDER — SODIUM CHLORIDE 0.9 % IV SOLN: Freq: Once | INTRAVENOUS | Status: DC | PRN

## 2021-10-29 MED ORDER — LORATADINE 10 MG PO TABS
10.0000 mg | ORAL_TABLET | Freq: Every day | ORAL | Status: DC
  Administered 2021-10-29: 10 mg via ORAL
  Filled 2021-10-29: qty 1

## 2021-10-29 MED ORDER — DIPHENHYDRAMINE HCL 25 MG PO CAPS: 25.0000 mg | ORAL_CAPSULE | Freq: Once | ORAL | Status: DC

## 2021-10-29 MED ORDER — SODIUM CHLORIDE 0.9 % IV BOLUS
1000.0000 mL | Freq: Once | INTRAVENOUS | Status: AC
Start: 2021-10-29 — End: 2021-10-29
  Administered 2021-10-29: 1000 mL via INTRAVENOUS

## 2021-10-29 MED ORDER — SODIUM CHLORIDE 0.9 % IV SOLN: Freq: Once | INTRAVENOUS | Status: AC

## 2021-10-29 MED ORDER — SODIUM CHLORIDE 0.9 % IV SOLN
400.0000 mg | Freq: Once | INTRAVENOUS | Status: AC
  Administered 2021-10-29: 400 mg via INTRAVENOUS
  Filled 2021-10-29: qty 20

## 2021-10-29 MED ORDER — DIPHENHYDRAMINE HCL 50 MG/ML IJ SOLN: 50.0000 mg | Freq: Once | INTRAMUSCULAR | Status: AC | PRN

## 2021-10-29 MED ORDER — SODIUM CHLORIDE 0.9 % IV SOLN
8.0000 mg | Freq: Once | INTRAVENOUS | Status: AC
  Administered 2021-10-29: 8 mg via INTRAVENOUS

## 2021-10-29 MED ORDER — FAMOTIDINE IN NACL 20-0.9 MG/50ML-% IV SOLN
20.0000 mg | Freq: Once | INTRAVENOUS | Status: AC
  Administered 2021-10-29: 20 mg via INTRAVENOUS
  Filled 2021-10-29: qty 50

## 2021-10-29 NOTE — Progress Notes (Signed)
Patient started feeling dizzy suddenly while getting ready to leave.  Patient then started vomiting with continued dizziness.  Dr. Bertis Ruddy contacted and orders received for additional fluid bolus, Zofran 8mg  IV and Benadryl 25mg  PO

## 2021-10-29 NOTE — Progress Notes (Signed)
Report given at bedside to Marcello Moores, RN in ED room 17

## 2021-10-30 ENCOUNTER — Other Ambulatory Visit: Payer: Self-pay | Admitting: Hematology and Oncology

## 2021-10-30 ENCOUNTER — Encounter: Payer: Self-pay | Admitting: Hematology and Oncology

## 2021-10-31 ENCOUNTER — Telehealth: Payer: Self-pay

## 2021-10-31 ENCOUNTER — Telehealth: Payer: Self-pay | Admitting: Family

## 2021-10-31 NOTE — Telephone Encounter (Signed)
Called and given below message. She verbalized understanding and will resume oral Iron. She appreciated the call.

## 2021-11-02 ENCOUNTER — Encounter: Payer: Self-pay | Admitting: Family

## 2021-11-02 ENCOUNTER — Ambulatory Visit: Payer: 59 | Admitting: Family

## 2021-11-02 VITALS — BP 122/72 | HR 86 | Temp 97.9°F | Ht 65.0 in | Wt 155.4 lb

## 2021-11-02 DIAGNOSIS — N920 Excessive and frequent menstruation with regular cycle: Secondary | ICD-10-CM | POA: Diagnosis not present

## 2021-11-02 DIAGNOSIS — T7840XA Allergy, unspecified, initial encounter: Secondary | ICD-10-CM | POA: Diagnosis not present

## 2021-11-02 DIAGNOSIS — D649 Anemia, unspecified: Secondary | ICD-10-CM | POA: Diagnosis not present

## 2021-11-02 MED ORDER — MEDROXYPROGESTERONE ACETATE 5 MG PO TABS: 5.0000 mg | ORAL_TABLET | Freq: Every day | ORAL | 5 refills | Status: DC

## 2021-11-02 NOTE — Progress Notes (Addendum)
Subjective:     Patient ID: Teresa Middleton, female    DOB: [birth year], 26 y.o.   MRN: [id number]  Chief Complaint  Patient presents with   Medication Reaction    Pt went to her Iron Infusion on 6/24. When the The IV was taken out, Pt states she said it felt like pens and needles on left side where IV was placed. Pt states the left side of her body was cold and she could not stand up so she was sent ED. Pt states she was having painful cramping and still has not had a cycle in 2 weeks. Given dilaudid in ED.     HPI: Iron infusion reaction:  received prophylactic allergy meds, but as soon as the infusion was over she felt sick, tingling, felt cold, abdominal cramping, sent to ER and treated with fluids and Dilaudid, TVUS normal, and released. States she feels slightly better, still tired, craves ice sometimes. Had her menses day after ER visit, which was 3 weeks late, with heavy bleeding for first few days, normal bleeding today.   Anemia: Patient presents for presents evaluation of anemia. Anemia was found by chronic condition, under care of Hematology, but not seen since last year and required an iron infusion.  It has been present for many years.  Associated signs & symptoms: reports fatigue if Hgb goes down closer to 6 or lower range. Reports she is not taking any iron currently and reports still having heavy cycles, on her cycle currently. Last Hgb 6.1 - seen by HEME and told to schedule blood transfusion and iron infusion, pt had infusion on 6/24,  [facility] transfusion on 6/8, Hgb in ER 12, mcv, mch low.   Assessment & Plan:   Problem List Items Addressed This Visit       Other   Acute on chronic anemia    Chronic - 6/24 iron infusion completed but had bad reaction after - sent straight to ER, given fluids, dilaudid for abd pain, TVUS normal, Hgb 12.2 sent home, next day started her menses, heavy first few days, then normal flow. Advised to restart her OTC iron pills qd. Continue to advise on  using hormones to control bleeding during cycles. F/U in 8mos or prn.      Menorrhagia with regular cycle    Chronic - causing severe anemia; pt hesitant in past to use hormones for control, but recently had iron infusion and for 2nd time had a bad reaction, also does not tolerate po iron very well. Advised on medroxyprogesterone to use just during her menses to lessen flow, also use of 800mg  Ibuprofen tid during cycle. Advised on use & SE of both meds.      Relevant Medications   medroxyPROGESTERone (PROVERA) 5 MG tablet   Other Visit Diagnoses     Allergic reaction to drug, initial encounter    -  Primary sent to ER right after infusion, treated with fluids, sent home, states she is feeling better. Unsure of future of any iron infusions d/t reaction every time even with prophylactic meds. Advised pt to restart her daily OTC iron.      Outpatient Medications Prior to Visit  Medication Sig Dispense Refill   albuterol (VENTOLIN HFA) 108 (90 Base) MCG/ACT inhaler Inhale 2 puffs into the lungs every 6 (six) hours as needed for wheezing or shortness of breath. 8 g 1   amphetamine-dextroamphetamine (ADDERALL) 20 MG tablet Take 1 tablet (20 mg total) by mouth daily before breakfast. 30 tablet  0   [START ON 11/26/2021] amphetamine-dextroamphetamine (ADDERALL) 20 MG tablet Take 1 tablet (20 mg total) by mouth daily before breakfast. 30 tablet 0   cetirizine (ZYRTEC ALLERGY) 10 MG tablet Take 1 tablet (10 mg total) by mouth daily. 14 tablet 0   dicyclomine (BENTYL) 20 MG tablet Take 1 tablet (20 mg total) by mouth 2 (two) times daily as needed for spasms. 20 tablet 0   ferrous sulfate 324 MG TBEC Take 1 tablet (324 mg total) by mouth daily. 30 tablet 5   predniSONE (STERAPRED UNI-PAK 21 TAB) 10 MG (21) TBPK tablet Take by mouth daily. Take 6 tabs by mouth daily  for 2 days, then 5 tabs for 2 days, then 4 tabs for 2 days, then 3 tabs for 2 days, 2 tabs for 2 days, then 1 tab by mouth daily for 2 days  42 tablet 0   sucralfate (CARAFATE) 1 g tablet Take 1 tablet (1 g total) by mouth 4 (four) times daily -  with meals and at bedtime for 7 days. 28 tablet 0   amphetamine-dextroamphetamine (ADDERALL) 20 MG tablet Take 1 tablet (20 mg total) by mouth daily before breakfast. 30 tablet 0   No facility-administered medications prior to visit.    Past Medical History:  Diagnosis Date   ADD (attention deficit disorder)    Anemia     History reviewed. No pertinent surgical history.  Allergies  Allergen Reactions   Iron Sucrose Hives and Nausea And Vomiting   Feraheme [Ferumoxytol] Hives       Objective:    Physical Exam Vitals and nursing note reviewed.  Constitutional:      Appearance: Normal appearance.  Cardiovascular:     Rate and Rhythm: Normal rate and regular rhythm.  Pulmonary:     Effort: Pulmonary effort is normal.     Breath sounds: Normal breath sounds.  Musculoskeletal:        General: Normal range of motion.  Skin:    General: Skin is warm and dry.  Neurological:     Mental Status: She is alert.  Psychiatric:        Mood and Affect: Mood normal.        Behavior: Behavior normal.     BP 122/72 (BP Location: Left Arm, Patient Position: Sitting, Cuff Size: Large)   Pulse 86   Temp 97.9 F (36.6 C) (Temporal)   Ht 5\' 5"  (1.651 m)   Wt 155 lb 6 oz (70.5 kg)   LMP 10/30/2021 (Exact Date) Comment: 2 Weeks late  SpO2 94%   BMI 25.86 kg/m  Wt Readings from Last 3 Encounters:  11/02/21 155 lb 6 oz (70.5 kg)  10/29/21 155 lb (70.3 kg)  10/27/21 155 lb 2 oz (70.4 kg)        Meds ordered this encounter  Medications   medroxyPROGESTERone (PROVERA) 5 MG tablet    Sig: Take 1-2 tablets (5-10 mg total) by mouth daily. At the onset of your menstrual cycle. Take 2 pills if flow is not lessened with 1 pill, stop pills as flow lightens after 6-7 days.    Dispense:  30 tablet    Refill:  5    Order Specific Question:   Supervising Provider    Answer:   ANDY,  CAMILLE L [[id number]]    10/29/21, NP

## 2021-11-02 NOTE — Assessment & Plan Note (Addendum)
Chronic - 6/24 iron infusion completed but had bad reaction after - sent straight to ER, given fluids, dilaudid for abd pain, TVUS normal, Hgb 12.2 sent home, next day started her menses, heavy first few days, then normal flow. Advised to restart her OTC iron pills qd. Continue to advise on using hormones to control bleeding during cycles. F/U in 33mos or prn.

## 2021-11-02 NOTE — Assessment & Plan Note (Signed)
Chronic - causing severe anemia; pt hesitant in past to use hormones for control, but recently had iron infusion and for 2nd time had a bad reaction, also does not tolerate po iron very well. Advised on medroxyprogesterone to use just during her menses to lessen flow, also use of 800mg  Ibuprofen tid during cycle. Advised on use & SE of both meds.

## 2021-11-05 ENCOUNTER — Inpatient Hospital Stay: Payer: 59

## 2021-11-09 ENCOUNTER — Ambulatory Visit (INDEPENDENT_AMBULATORY_CARE_PROVIDER_SITE_OTHER): Payer: 59 | Admitting: Psychology

## 2021-11-09 DIAGNOSIS — F4323 Adjustment disorder with mixed anxiety and depressed mood: Secondary | ICD-10-CM

## 2021-11-09 NOTE — Progress Notes (Addendum)
[facility] Counselor/Therapist Progress Note  Patient ID: Teresa Middleton, MRN: [id number],    Date: 11/09/2021  Time Spent: 45 mins  Treatment Type: Individual Therapy  Reported Symptoms: Pt presents for session via webex video.  Pt grants consent for session, stating she is in her home with no one else present.  I shared with pt that I am in my office with no one else here.   Mental Status Exam: Appearance:  Casual     Behavior: Appropriate  Motor: Normal  Speech/Language:  Clear and Coherent  Affect: Appropriate  Mood: normal  Thought process: normal  Thought content:   WNL  Sensory/Perceptual disturbances:   WNL  Orientation: oriented to person, place, and time/date  Attention: Good  Concentration: Good  Memory: WNL  Fund of knowledge:  Good  Insight:   Good  Judgment:  Good  Impulse Control: Good   Risk Assessment: Danger to Self:  No Self-injurious Behavior: No Danger to Others: No Duty to Warn:no Physical Aggression / Violence:No  Access to Firearms a concern: No  Gang Involvement:No   Subjective: Pt shares that she was supposed to return to [employer] today; her doctor had written her out of work until today.  She has accepted a new job with [employer] (7/17) and will not plan to return to [employer].  She had another Iron infusion last week and had another allergic reaction to the infusion.  She had to go to the ED as a result of the reaction.  She has a follow up appt with the hematologist in early August.  She also saw her PCP due to the [facility] and the ED visit.  Her PCP is suggesting blood transfusions instead of doing the iron infusions.  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  She will be working 9am to [employer], M-F.  She will  not have to work weekends or holidays.  She will also be making more money than she was at [employer].  Pt shares that she has been playing video games with friends and streaming while she plays and she is still going to the gym with her friends, etc.  Talked with pt about the benefits of developing a Gratitude List; encouraged pt to start working on it and to keep in a place where she can see it daily and add to it as she thinks of new things to be thankful for.  Pt shares she will need to call the office to schedule a follow up visit, since she is changing jobs and is not sure when her [employer] will start.  Encouraged pt to continue doing things that will be beneficial for her.   Interventions: Cognitive Behavioral Therapy  Diagnosis:Adjustment disorder with mixed anxiety and depressed mood  Plan: Pt will call the office to schedule a follow up session when she finds out when her [employer] health insurance will become effective.  [employer] date 02/09/2022.  Karie Kirks, [facility]

## 2021-11-12 ENCOUNTER — Inpatient Hospital Stay: Payer: 59

## 2021-11-18 ENCOUNTER — Telehealth: Payer: Self-pay | Admitting: Family

## 2021-11-18 NOTE — Telephone Encounter (Signed)
..  Type of form received: Short Term Disability  Additional comments:   Received by: Lorene Dy  Form should be Faxed to: [phone]  Form should be mailed to:    Is patient requesting call for pickup:   Form placed:  In provider's box  Attach charge sheet. yes  Individual made aware of 3-5 business day turn around (Y/N)?

## 2021-12-08 DIAGNOSIS — Z0279 Encounter for issue of other medical certificate: Secondary | ICD-10-CM

## 2021-12-12 ENCOUNTER — Inpatient Hospital Stay: Payer: 59 | Admitting: Hematology and Oncology

## 2021-12-12 ENCOUNTER — Encounter: Payer: Self-pay | Admitting: Hematology and Oncology

## 2021-12-12 ENCOUNTER — Inpatient Hospital Stay: Payer: 59 | Attending: Hematology and Oncology

## 2021-12-28 ENCOUNTER — Ambulatory Visit: Payer: 59 | Admitting: Family

## 2021-12-28 NOTE — Progress Notes (Deleted)
   Patient ID: Teresa Middleton, female    DOB: 03-14-1996, 26 y.o.   MRN: [id number]  No chief complaint on file.   HPI: ADHD f/u: Medications helping target goals: Adderall IR Regimen: daily Medication side effects/concerns: none, tolerates well Weight: no change Sleep: no concerns Mood changes: denies Tics: denies Blood pressure, Weight, Pulse, Behavior reviewed: wnl  Assessment & Plan:   Problem List Items Addressed This Visit   None   Subjective:    Outpatient Medications Prior to Visit  Medication Sig Dispense Refill   albuterol (VENTOLIN HFA) 108 (90 Base) MCG/ACT inhaler Inhale 2 puffs into the lungs every 6 (six) hours as needed for wheezing or shortness of breath. 8 g 1   amphetamine-dextroamphetamine (ADDERALL) 20 MG tablet Take 1 tablet (20 mg total) by mouth daily before breakfast. 30 tablet 0   amphetamine-dextroamphetamine (ADDERALL) 20 MG tablet Take 1 tablet (20 mg total) by mouth daily before breakfast. 30 tablet 0   cetirizine (ZYRTEC ALLERGY) 10 MG tablet Take 1 tablet (10 mg total) by mouth daily. 14 tablet 0   dicyclomine (BENTYL) 20 MG tablet Take 1 tablet (20 mg total) by mouth 2 (two) times daily as needed for spasms. 20 tablet 0   ferrous sulfate 324 MG TBEC Take 1 tablet (324 mg total) by mouth daily. 30 tablet 5   medroxyPROGESTERone (PROVERA) 5 MG tablet Take 1-2 tablets (5-10 mg total) by mouth daily. At the onset of your menstrual cycle. Take 2 pills if flow is not lessened with 1 pill, stop pills as flow lightens after 6-7 days. 30 tablet 5   predniSONE (STERAPRED UNI-PAK 21 TAB) 10 MG (21) TBPK tablet Take by mouth daily. Take 6 tabs by mouth daily  for 2 days, then 5 tabs for 2 days, then 4 tabs for 2 days, then 3 tabs for 2 days, 2 tabs for 2 days, then 1 tab by mouth daily for 2 days 42 tablet 0   sucralfate (CARAFATE) 1 g tablet Take 1 tablet (1 g total) by mouth 4 (four) times daily -  with meals and at bedtime for 7 days. 28 tablet 0   No  facility-administered medications prior to visit.   Past Medical History:  Diagnosis Date   ADD (attention deficit disorder)    No past surgical history on file. Allergies  Allergen Reactions   Feraheme [Ferumoxytol] Hives   Iron Sucrose Hives and Nausea And Vomiting      Objective:    Physical Exam There were no vitals taken for this visit. Wt Readings from Last 3 Encounters:  11/02/21 155 lb 6 oz (70.5 kg)  10/29/21 155 lb (70.3 kg)  10/27/21 155 lb 2 oz (70.4 kg)       Dulce Sellar, NP

## 2022-01-30 ENCOUNTER — Encounter: Payer: Self-pay | Admitting: *Deleted

## 2022-03-08 ENCOUNTER — Telehealth: Payer: Self-pay

## 2022-03-08 NOTE — Telephone Encounter (Signed)
I called pt to see how she has been doing due to not hearing from pt in a couple of months. LVM for pt.

## 2022-03-09 NOTE — Progress Notes (Signed)
Received no show letter for 12/12/21 back to the office. The mail stated not deliverable and unable to forward. Letter was mailed out on 12/12/21 to address listed in epic.

## 2022-03-17 ENCOUNTER — Encounter: Payer: Self-pay | Admitting: Emergency Medicine

## 2022-03-17 ENCOUNTER — Ambulatory Visit
Admission: EM | Admit: 2022-03-17 | Discharge: 2022-03-17 | Disposition: A | Discharge status: Home / Self Care | Payer: 59 | Attending: Urgent Care | Admitting: Urgent Care

## 2022-03-17 DIAGNOSIS — J988 Other specified respiratory disorders: Secondary | ICD-10-CM | POA: Insufficient documentation

## 2022-03-17 DIAGNOSIS — B9789 Other viral agents as the cause of diseases classified elsewhere: Secondary | ICD-10-CM | POA: Insufficient documentation

## 2022-03-17 DIAGNOSIS — Z1152 Encounter for screening for COVID-19: Secondary | ICD-10-CM | POA: Insufficient documentation

## 2022-03-17 DIAGNOSIS — J4520 Mild intermittent asthma, uncomplicated: Secondary | ICD-10-CM | POA: Insufficient documentation

## 2022-03-17 MED ORDER — PREDNISONE 20 MG PO TABS: ORAL_TABLET | ORAL | 0 refills | Status: DC

## 2022-03-17 MED ORDER — PROMETHAZINE-DM 6.25-15 MG/5ML PO SYRP: 5.0000 mL | ORAL_SOLUTION | Freq: Three times a day (TID) | ORAL | 0 refills | Status: DC | PRN

## 2022-03-17 MED ORDER — BENZONATATE 100 MG PO CAPS: 100.0000 mg | ORAL_CAPSULE | Freq: Three times a day (TID) | ORAL | 0 refills | Status: DC | PRN

## 2022-03-17 MED ORDER — CETIRIZINE HCL 10 MG PO TABS: 10.0000 mg | ORAL_TABLET | Freq: Every day | ORAL | 0 refills | Status: DC

## 2022-03-17 NOTE — ED Provider Notes (Signed)
[facility]  Note:  This document was prepared using Conservation officer, historic buildings and may include unintentional dictation errors.  MRN: [id number] DOB: 08-09-1995  Subjective:   Teresa Middleton is a 26 y.o. female presenting for 5-day history of acute onset persistent coughing, chest tightness, wheezing, shortness of breath.  Patient has been using her albuterol inhaler with very temporary relief relief.  She has previously used an albuterol inhaler for bronchitis and exercise-induced asthma.  Has never been formally diagnosed with asthma.  However, there is a strong family history of asthma.  No fever, throat pain, sinus congestion.  Patient is not a smoker.  No drug use.  No vaping.  No current facility-administered medications for this encounter.  Current Outpatient Medications:    albuterol (VENTOLIN HFA) 108 (90 Base) MCG/ACT inhaler, Inhale 2 puffs into the lungs every 6 (six) hours as needed for wheezing or shortness of breath., Disp: 8 g, Rfl: 1   amphetamine-dextroamphetamine (ADDERALL) 20 MG tablet, Take 1 tablet (20 mg total) by mouth daily before breakfast., Disp: 30 tablet, Rfl: 0   cetirizine (ZYRTEC ALLERGY) 10 MG tablet, Take 1 tablet (10 mg total) by mouth daily., Disp: 14 tablet, Rfl: 0   dicyclomine (BENTYL) 20 MG tablet, Take 1 tablet (20 mg total) by mouth 2 (two) times daily as needed for spasms., Disp: 20 tablet, Rfl: 0   ferrous sulfate 324 MG TBEC, Take 1 tablet (324 mg total) by mouth daily., Disp: 30 tablet, Rfl: 5   medroxyPROGESTERone (PROVERA) 5 MG tablet, Take 1-2 tablets (5-10 mg total) by mouth daily. At the onset of your menstrual cycle. Take 2 pills if flow is not lessened with 1 pill, stop pills as flow lightens after 6-7 days., Disp: 30 tablet, Rfl: 5   sucralfate (CARAFATE) 1 g tablet, Take 1 tablet (1 g total) by mouth 4 (four) times daily -  with meals and at bedtime for 7 days., Disp: 28 tablet, Rfl: 0   Allergies  Allergen  Reactions   Feraheme [Ferumoxytol] Hives   Iron Sucrose Hives and Nausea And Vomiting    Past Medical History:  Diagnosis Date   ADD (attention deficit disorder)      History reviewed. No pertinent surgical history.  Family History  Problem Relation Age of Onset   Asthma Mother    Cancer Father     Social History   Tobacco Use   Smoking status: Never   Smokeless tobacco: Never  Vaping Use   Vaping Use: Never used  Substance Use Topics   Alcohol use: Not Currently    Comment: occ   Drug use: Never    ROS   Objective:   Vitals: BP 108/69 (BP Location: Left Arm)   Pulse 85   Temp 98.8 F (37.1 C) (Oral)   Resp 16   LMP 03/03/2022   SpO2 98%   Physical Exam Constitutional:      General: She is not in acute distress.    Appearance: Normal appearance. She is well-developed. She is not ill-appearing, toxic-appearing or diaphoretic.  HENT:     Head: Normocephalic and atraumatic.     Nose: Nose normal.     Mouth/Throat:     Mouth: Mucous membranes are moist.     Pharynx: No pharyngeal swelling, oropharyngeal exudate, posterior oropharyngeal erythema or uvula swelling.     Tonsils: No tonsillar exudate or tonsillar abscesses. 0 on the right. 0 on the left.  Eyes:     General:  No scleral icterus.       Right eye: No discharge.        Left eye: No discharge.     Extraocular Movements: Extraocular movements intact.  Cardiovascular:     Rate and Rhythm: Normal rate and regular rhythm.     Heart sounds: Normal heart sounds. No murmur heard.    No friction rub. No gallop.  Pulmonary:     Effort: Pulmonary effort is normal. No respiratory distress.     Breath sounds: No stridor. Examination of the right-upper field reveals wheezing. Examination of the left-upper field reveals wheezing. Examination of the right-middle field reveals wheezing. Examination of the left-middle field reveals wheezing. Wheezing (inspiratory wheezing) present. No decreased breath sounds,  rhonchi or rales.  Chest:     Chest wall: No tenderness.  Skin:    General: Skin is warm and dry.  Neurological:     General: No focal deficit present.     Mental Status: She is alert and oriented to person, place, and time.  Psychiatric:        Mood and Affect: Mood normal.        Behavior: Behavior normal.     Assessment and Plan :   PDMP not reviewed this encounter.  1. Viral respiratory illness   2. Mild intermittent asthma without complication     Recommended oral prednisone course.  Advised that she continue to use her albuterol inhaler as needed for her mild intermittent asthma.  This is likely aggravated by a viral respiratory illness.  Patient would like to get COVID tested. Deferred imaging given lung exam consistent with wheezing only.  No signs of rales or crackles, rhonchi.  Use supportive care otherwise. Counseled patient on potential for adverse effects with medications prescribed/recommended today, ER and return-to-clinic precautions discussed, patient verbalized understanding.    Wallis Bamberg, PA-C 03/17/22 1308

## 2022-03-17 NOTE — ED Triage Notes (Signed)
Cough, tightness in chest, wheezing, mild SOB started Monday. Using albuterol inhaler with minimal relief initially, then chest tightens back up. Family hx of asthma. Patient has had exercise induced asthma and bronchitis in past. Denies fever, sore throat, nasal congestion

## 2022-03-18 LAB — SARS CORONAVIRUS 2 (TAT 6-24 HRS): SARS Coronavirus 2: NEGATIVE

## 2022-04-20 ENCOUNTER — Encounter: Payer: Self-pay | Admitting: *Deleted

## 2022-06-22 ENCOUNTER — Ambulatory Visit: Payer: Self-pay | Admitting: Family

## 2022-07-11 ENCOUNTER — Telehealth: Payer: Self-pay | Admitting: Family

## 2022-07-11 NOTE — Telephone Encounter (Signed)
Patient scheduled OV with PCP on 07/13/22 due to illness and coughing up some blood. I called pt for more info.   Patient states: - Sick since Sunday; thick green phelgm w/ blood specks and possible tissue - Has been taking tylenol and dayquil/nyquil but fever returns   Patient has been transferred to triage.

## 2022-07-11 NOTE — Telephone Encounter (Signed)
Final Disposition is to Go to ED Now.  Patient Name: Teresa Middleton [facility] Gender: Female DOB: [birth year] Age: 27 Y 9 M 26 D Return Phone Number: [phone] (Primary) Address: City/ State/ Zip: [place]  [address] Client [facility] [place] [facility] Provider Garret Reddish- MD Contact Type Call Who Is Calling Patient / Member / Family / Caregiver Call Type Triage / Clinical Relationship To Patient Self Return Phone Number [phone] (Primary) Chief Complaint Coughing Up Blood Reason for Call Symptomatic / Request for [place] states she is coughing up blood. Caller states she has been sick since Sunday. Caller states she does have an appt. Translation No Nurse Assessment Nurse: Ysidro Evert, RN, Levada Dy Date/Time (Eastern Time): 07/11/2022 11:51:21 AM Confirm and document reason for call. If symptomatic, describe symptoms. ---Caller states she has been having a cough since Sunday and she has coughed up some blood. She feels feverish and is was 100.8 last night Does the patient have any new or worsening symptoms? ---Yes Will a triage be completed? ---Yes Related visit to physician within the last 2 weeks? ---No Does the PT have any chronic conditions? (i.e. diabetes, asthma, this includes High risk factors for pregnancy, etc.) ---Yes List chronic conditions. ---anemic Is the patient pregnant or possibly pregnant? (Ask all females between the ages of 32-55) ---No Is this a behavioral health or substance abuse call? ---No Guidelines Guideline Title Affirmed Question Affirmed Notes Nurse Date/Time (Eastern Time) Coughing Up Blood [1] MODERATE difficulty breathing (e.g., speaks in phrases, SOB even at rest, pulse 100-120) Ysidro Evert, RN, Levada Dy 07/11/2022 11:53:22 AM PLEASE NOTE: All timestamps contained within this report are represented as [place] Standard  Time. CONFIDENTIALTY NOTICE: This fax transmission is intended only for the addressee. It contains information that is legally privileged, confidential or otherwise protected from use or disclosure. If you are not the intended recipient, you are strictly prohibited from reviewing, disclosing, copying using or disseminating any of this information or taking any action in reliance on or regarding this information. If you have received this fax in error, please notify us immediately by telephone so that we can arrange for its return to [place]. Phone: [phone], Toll-Free: [phone], Fax: [phone] Page: 2 of 2 Call Id: [id number] Guidelines Guideline Title Affirmed Question Affirmed Notes Nurse Date/Time Eilene Ghazi Time) AND [2] still present when not coughing Disp. Time Eilene Ghazi Time) Disposition Final User 07/11/2022 11:56:21 AM Go to ED Now Yes Ysidro Evert, RN, Levada Dy Final Disposition 07/11/2022 11:56:21 AM Go to ED Now Yes Ysidro Evert, RN, Marin Shutter Disagree/Comply Comply Caller Understands Yes PreDisposition Did not know what to do Care Advice Given Per Guideline GO TO ED NOW: * You need to be seen in the Emergency Department. * Go to the ED at ___________ [place] now. Drive carefully. ANOTHER ADULT SHOULD DRIVE: * It is better and safer if another adult drives instead of you. CARE ADVICE given per Coughing Up Blood guideline. * Severe difficulty breathing * Lips or face turns blue * Passes out or becomes confused. CALL EMS 911 IF: Referrals [facility] - ED

## 2022-07-12 ENCOUNTER — Encounter: Payer: Self-pay | Admitting: Family

## 2022-07-12 ENCOUNTER — Ambulatory Visit: Payer: BC Managed Care – PPO | Admitting: Family

## 2022-07-12 VITALS — BP 131/82 | HR 86 | Temp 98.0°F | Ht 65.0 in | Wt 173.5 lb

## 2022-07-12 DIAGNOSIS — K30 Functional dyspepsia: Secondary | ICD-10-CM | POA: Diagnosis not present

## 2022-07-12 DIAGNOSIS — J069 Acute upper respiratory infection, unspecified: Secondary | ICD-10-CM | POA: Diagnosis not present

## 2022-07-12 DIAGNOSIS — D509 Iron deficiency anemia, unspecified: Secondary | ICD-10-CM

## 2022-07-12 LAB — POC COVID19 BINAXNOW: SARS Coronavirus 2 Ag: NEGATIVE

## 2022-07-12 MED ORDER — FERROUS SULFATE ER 143 (45 FE) MG PO TBCR: 1.0000 | EXTENDED_RELEASE_TABLET | Freq: Every day | ORAL | 5 refills | Status: AC

## 2022-07-12 MED ORDER — PANTOPRAZOLE SODIUM 20 MG PO TBEC: 20.0000 mg | DELAYED_RELEASE_TABLET | Freq: Every day | ORAL | 5 refills | Status: DC

## 2022-07-12 NOTE — Patient Instructions (Addendum)
It was very nice to see you today!   Remember to use your inhaler at least 2-3 times per day while you are recovering from your respiratory infection. Remember to drink at least 2 liters of water daily. Call back Monday if you are not feeling any better.   I have sent a new referral for Hematology - look for their call & get in to have your iron checked! Very important to get your level up to help your fatigue, chest pain, etc.   I also sent over generic Protonix for your stomach pain. If not covered, look for generic Prilosec or Nexium and take '20mg'$  twice a day for 2 weeks, then just daily.  Reduce the acid in your diet, as well as caffeine & stress! Do not lay down until 1-2 hours after eating.     PLEASE NOTE:  If you had any lab tests please let us know if you have not heard back within a few days. You may see your results on MyChart before we have a chance to review them but we will give you a call once they are reviewed by [place]. If we ordered any referrals today, please let us know if you have not heard from their office within the next week.

## 2022-07-12 NOTE — Progress Notes (Signed)
Patient ID: Teresa Middleton, female    DOB: 11-Jul-1995, 27 y.o.   MRN: [id number]  Chief Complaint  Patient presents with  . Sinus Problem    sx for 4d  . Anemia    Pt states when she's on her cycle, fatigue and migraines are worse but when she's not no fatigue. Pt states she only completed one infusion back in June.   . Gastroesophageal Reflux    HPI: Anemia:  HX:  Patient presents for f/u. Anemia was found by chronic condition, under care of Hematology, but not seen since last year and required an iron infusion.  It has been present for many years.  Associated signs & symptoms: reports fatigue if Hgb goes down closer to 6 or lower range. Reports she is not taking any iron currently and reports still having heavy cycles, on her cycle currently. Last Hgb 6.1 - seen by [facility] and told to schedule blood transfusion and iron infusion, but pt states her work will not let her off of work to do this. 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    URI sx:  last fever yesterday. Pt c/o chills, fever of 101.2, swollen, painful, lymph nodes, Cough with spots of blood mixed with green mucus, Present for 4 days. Has tried dayquil and tylenol which did help.   GERD:  Pt states after she eat, she has abdominal pain for about a hour. Pt states she goes to bed and she feels better.   Assessment & Plan:  Chronic iron deficiency anemia -     Ambulatory referral to Hematology / Oncology -     Ferrous Sulfate ER; Take 1 tablet by mouth daily.  Dispense: 30 tablet; Refill: 5  Acid indigestion -     Pantoprazole Sodium; Take 1 tablet (20 mg total) by mouth daily. Take 2 hours after or before your iron pill. Take 1 pill twice a day for 2 weeks, then 1 pill every morning.  Dispense: 45 tablet; Refill: 5  Viral upper respiratory tract infection   Subjective:    Outpatient Medications Prior to Visit  Medication Sig Dispense Refill  . albuterol (VENTOLIN HFA) 108 (90 Base) MCG/ACT inhaler Inhale 2 puffs into the  lungs every 6 (six) hours as needed for wheezing or shortness of breath. 8 g 1  . benzonatate (TESSALON) 100 MG capsule Take 1 capsule (100 mg total) by mouth 3 (three) times daily as needed for cough. 30 capsule 0  . cetirizine (ZYRTEC ALLERGY) 10 MG tablet Take 1 tablet (10 mg total) by mouth daily. 30 tablet 0  . dicyclomine (BENTYL) 20 MG tablet Take 1 tablet (20 mg total) by mouth 2 (two) times daily as needed for spasms. 20 tablet 0  . predniSONE (DELTASONE) 20 MG tablet Take 2 tablets daily with breakfast. 10 tablet 0  . promethazine-dextromethorphan (PROMETHAZINE-DM) 6.25-15 MG/5ML syrup Take 5 mLs by mouth 3 (three) times daily as needed for cough. 100 mL 0  . amphetamine-dextroamphetamine (ADDERALL) 20 MG tablet Take 1 tablet (20 mg total) by mouth daily before breakfast. 30 tablet 0  . medroxyPROGESTERone (PROVERA) 5 MG tablet Take 1-2 tablets (5-10 mg total) by mouth daily. At the onset of your menstrual cycle. Take 2 pills if flow is not lessened with 1 pill, stop pills as flow lightens after 6-7 days. (Patient not taking: Reported on 07/12/2022) 30 tablet 5  . ferrous sulfate 324 MG TBEC Take 1 tablet (324 mg total) by mouth daily. (Patient not taking:  Reported on 07/12/2022) 30 tablet 5  . sucralfate (CARAFATE) 1 g tablet Take 1 tablet (1 g total) by mouth 4 (four) times daily -  with meals and at bedtime for 7 days. 28 tablet 0   No facility-administered medications prior to visit.   Past Medical History:  Diagnosis Date  . ADD (attention deficit disorder)    No past surgical history on file. Allergies  Allergen Reactions  . Feraheme [Ferumoxytol] Hives  . Iron Sucrose Hives and Nausea And Vomiting      Objective:    Physical Exam Vitals and nursing note reviewed.  Constitutional:      Appearance: Normal appearance. She is ill-appearing.     Interventions: Face mask in place.  HENT:     Right Ear: Tympanic membrane and ear canal normal.     Left Ear: Tympanic membrane and  ear canal normal.     Nose:     Right Sinus: Frontal sinus tenderness present.     Left Sinus: Frontal sinus tenderness present.     Mouth/Throat:     Mouth: Mucous membranes are moist.     Pharynx: Posterior oropharyngeal erythema (mild) present. No pharyngeal swelling, oropharyngeal exudate or uvula swelling.     Tonsils: No tonsillar exudate or tonsillar abscesses. 1+ on the right. 1+ on the left.  Cardiovascular:     Rate and Rhythm: Normal rate and regular rhythm.  Pulmonary:     Effort: Pulmonary effort is normal.     Breath sounds: Examination of the right-upper field reveals wheezing. Examination of the left-upper field reveals wheezing. Wheezing (mild) present.  Musculoskeletal:        General: Normal range of motion.  Lymphadenopathy:     Head:     Right side of head: No preauricular or posterior auricular adenopathy.     Left side of head: No preauricular or posterior auricular adenopathy.     Cervical: No cervical adenopathy.  Skin:    General: Skin is warm and dry.  Neurological:     Mental Status: She is alert.  Psychiatric:        Mood and Affect: Mood normal.        Behavior: Behavior normal.   BP 131/82 (BP Location: Left Arm, Patient Position: Sitting, Cuff Size: Large)   Pulse 86   Temp 98 F (36.7 C) (Temporal)   Ht '5\' 5"'$  (1.651 m)   Wt 173 lb 8 oz (78.7 kg)   LMP 07/07/2022 (Approximate)   SpO2 95%   BMI 28.87 kg/m  Wt Readings from Last 3 Encounters:  07/12/22 173 lb 8 oz (78.7 kg)  11/02/21 155 lb 6 oz (70.5 kg)  10/29/21 155 lb (70.3 kg)       Jeanie Sewer, NP

## 2022-07-13 ENCOUNTER — Encounter: Payer: Self-pay | Admitting: Hematology and Oncology

## 2022-07-13 DIAGNOSIS — K30 Functional dyspepsia: Secondary | ICD-10-CM | POA: Insufficient documentation

## 2022-07-13 NOTE — Assessment & Plan Note (Signed)
New sending generic Protonix, '20mg'$  qd, advised on use & SE, also advised on low acid diet inc. limited caffeine & stress f/u in 3 mos or prn

## 2022-07-13 NOTE — Assessment & Plan Note (Signed)
Chronic see HPI for hx - last documented Hgb 10.1 in Nov 2023 sending new [facility] referral, did not like Alvy Bimler, states she has not gone back for f/u sending referral to [facility] location sending lower dose ferrous sulfate, slow release, advised pt on use & SE, also eat more iron-rich foods f/u prn

## 2022-08-07 ENCOUNTER — Telehealth: Payer: Self-pay

## 2022-08-07 NOTE — Telephone Encounter (Signed)
I called pt and LVM in regards.  

## 2022-08-07 NOTE — Telephone Encounter (Signed)
-----   Message from Jeanie Sewer, NP sent at 08/04/2022  1:09 PM EDT ----- Regarding: hematology referral So when I sent the referral, they just scheduled pt with Dr Alvy Bimler again, whom Kahdijah did not want to see again. Call [place] and let her know to call the hematology office at [place] (give her #) and just ask to schedule with a different provider. Thx!

## 2022-10-12 ENCOUNTER — Ambulatory Visit: Payer: BC Managed Care – PPO | Admitting: Family

## 2022-10-12 ENCOUNTER — Encounter: Payer: Self-pay | Admitting: Family

## 2022-10-12 ENCOUNTER — Telehealth: Payer: Self-pay | Admitting: *Deleted

## 2022-10-12 ENCOUNTER — Telehealth: Payer: Self-pay | Admitting: Hematology and Oncology

## 2022-10-12 VITALS — BP 134/81 | HR 87 | Temp 98.0°F | Ht 65.0 in | Wt 187.2 lb

## 2022-10-12 DIAGNOSIS — F900 Attention-deficit hyperactivity disorder, predominantly inattentive type: Secondary | ICD-10-CM

## 2022-10-12 DIAGNOSIS — F419 Anxiety disorder, unspecified: Secondary | ICD-10-CM

## 2022-10-12 DIAGNOSIS — D509 Iron deficiency anemia, unspecified: Secondary | ICD-10-CM

## 2022-10-12 DIAGNOSIS — T50905A Adverse effect of unspecified drugs, medicaments and biological substances, initial encounter: Secondary | ICD-10-CM

## 2022-10-12 DIAGNOSIS — R112 Nausea with vomiting, unspecified: Secondary | ICD-10-CM

## 2022-10-12 DIAGNOSIS — F32A Depression, unspecified: Secondary | ICD-10-CM

## 2022-10-12 LAB — CBC WITH DIFFERENTIAL/PLATELET
Basophils Absolute: 0 10*3/uL (ref 0.0–0.1)
Basophils Relative: 0.4 % (ref 0.0–3.0)
Eosinophils Absolute: 0.1 10*3/uL (ref 0.0–0.7)
Eosinophils Relative: 2.4 % (ref 0.0–5.0)
HCT: 27.4 % — ABNORMAL LOW (ref 36.0–46.0)
Hemoglobin: 8 g/dL — CL (ref 12.0–15.0)
Lymphocytes Relative: 31.2 % (ref 12.0–46.0)
Lymphs Abs: 1.1 10*3/uL (ref 0.7–4.0)
MCHC: 29.3 g/dL — ABNORMAL LOW (ref 30.0–36.0)
MCV: 60.4 fl — ABNORMAL LOW (ref 78.0–100.0)
Monocytes Absolute: 0.3 10*3/uL (ref 0.1–1.0)
Monocytes Relative: 9.1 % (ref 3.0–12.0)
Neutro Abs: 2 10*3/uL (ref 1.4–7.7)
Neutrophils Relative %: 56.9 % (ref 43.0–77.0)
Platelets: 325 10*3/uL (ref 150.0–400.0)
RBC: 4.54 Mil/uL (ref 3.87–5.11)
RDW: 20 % — ABNORMAL HIGH (ref 11.5–15.5)
WBC: 3.5 10*3/uL — ABNORMAL LOW (ref 4.0–10.5)

## 2022-10-12 LAB — FERRITIN: Ferritin: 1.7 ng/mL — ABNORMAL LOW (ref 10.0–291.0)

## 2022-10-12 MED ORDER — AMPHETAMINE-DEXTROAMPHET ER 20 MG PO CP24
20.0000 mg | ORAL_CAPSULE | ORAL | 0 refills | Status: DC
Start: 2022-10-12 — End: 2022-10-18

## 2022-10-12 MED ORDER — ONDANSETRON HCL 4 MG PO TABS: 4.0000 mg | ORAL_TABLET | Freq: Three times a day (TID) | ORAL | 2 refills | Status: AC | PRN

## 2022-10-12 MED ORDER — AMPHETAMINE-DEXTROAMPHET ER 20 MG PO CP24
20.0000 mg | ORAL_CAPSULE | ORAL | 0 refills | Status: DC
Start: 2022-11-11 — End: 2022-10-18

## 2022-10-12 MED ORDER — SERTRALINE HCL 25 MG PO TABS: 25.0000 mg | ORAL_TABLET | Freq: Every day | ORAL | 2 refills | Status: AC

## 2022-10-12 MED ORDER — AMPHETAMINE-DEXTROAMPHET ER 20 MG PO CP24
20.0000 mg | ORAL_CAPSULE | ORAL | 0 refills | Status: DC
Start: 2022-12-11 — End: 2022-10-18

## 2022-10-12 NOTE — Telephone Encounter (Signed)
[facility] called with critical lab: Hemoglobin is 8.0.

## 2022-10-12 NOTE — Assessment & Plan Note (Signed)
Chronic see HPI for hx - last documented Hgb 10.1 in Nov 2023 sent new [facility] referral, however, they will not allow her to see different provider (most likely d/t non-compliance) sent lower dose ferrous sulfate, slow release, pt reports tolerating slightly better, but still having nausea, sending Zofran today to take prior to iron pill continue to advise on iron-rich foods will send new referral to outside hematology clinic f/u prn

## 2022-10-12 NOTE — Progress Notes (Signed)
Patient ID: Teresa Middleton, female    DOB: 11/05/1995, 27 y.o.   MRN: [id number]  Chief Complaint  Patient presents with   ADHD    Pt would like a Refill of Adderall   Anxiety    Discuss medications   Anemia    Pt would like labs.     HPI: Anxiety/Depression: Patient complains of anxiety disorder and depression .   She has the following symptoms: difficulty concentrating, fatigue, palpitations, sweating.  Onset of symptoms was approximately  years ago, She denies current suicidal and homicidal ideation.  Possible organic causes contributing are: endocrine/metabolic. Severe anemia. Risk factors:  chronic medical condition.   Previous treatment includes  no meds  and individual therapy.  She complains of the following side effects from the treatment:  not effective .    10/13/2021   10:08 AM  Depression screen PHQ 2/9  Decreased Interest 2  Down, Depressed, Hopeless 2  PHQ - 2 Score 4  Altered sleeping 2  Tired, decreased energy 3  Change in appetite 3  Feeling bad or failure about yourself  1  Trouble concentrating 2  Moving slowly or fidgety/restless 0  Suicidal thoughts 0  PHQ-9 Score 15  Difficult doing work/chores Very difficult       10/13/2021   10:07 AM  GAD 7 : Generalized Anxiety Score  Nervous, Anxious, on Edge 3  Control/stop worrying 2  Worry too much - different things 2  Trouble relaxing 1  Restless 1  Easily annoyed or irritable 1  Afraid - awful might happen 2  Total GAD 7 Score 12  Anxiety Difficulty Extremely difficult   ADHD f/u: Medications helping target goals: Adderall IR Regimen: daily Medication side effects/concerns: none, tolerates well Weight: no change Sleep: no concerns Mood changes: denies Tics: denies Blood pressure, Weight, Pulse, Behavior reviewed: wnl  Assessment & Plan:  Anxiety and depression Assessment & Plan: Doing better patient had been seeing a therapist but stopped, stated not helping wants to start medication,  discussed options starting Zoloft 25mg  qd, advised on use & SE Encouraged to try therapy again, sometimes it takes time to find the right provider  f/u 1 month - virtual or in-office  Orders: -     Sertraline HCl; Take 1 tablet (25 mg total) by mouth daily.  Dispense: 30 tablet; Refill: 2  ADHD (attention deficit hyperactivity disorder), inattentive type Assessment & Plan: Chronic previously taking IR Adderall 20mg  qd, pt thinks this makes her anxiety worse, starting new med today for anxiety advised switching to XR 20mg  Adderall, advised on use & SE sending refill today, f/u in 3 mos, but can let me know via MyChart in 1 month if med is not working for her  Orders: -     Amphetamine-Dextroamphet ER; Take 1 capsule (20 mg total) by mouth every morning.  Dispense: 30 capsule; Refill: 0 -     Amphetamine-Dextroamphet ER; Take 1 capsule (20 mg total) by mouth every morning.  Dispense: 30 capsule; Refill: 0 -     Amphetamine-Dextroamphet ER; Take 1 capsule (20 mg total) by mouth every morning.  Dispense: 30 capsule; Refill: 0  Chronic iron deficiency anemia Assessment & Plan: Chronic see HPI for hx - last documented Hgb 10.1 in Nov 2023 sent new HEME referral, however, they will not allow her to see different provider (most likely d/t non-compliance) sent lower dose ferrous sulfate, slow release, pt reports tolerating slightly better, but still having nausea, sending Zofran today to take  prior to iron pill continue to advise on iron-rich foods will send new referral to outside hematology clinic f/u prn  Orders: -     Ferritin -     CBC with Differential/Platelet -     Ambulatory referral to Hematology / Oncology  Drug-induced nausea and vomiting -     Ondansetron HCl; Take 1 tablet (4 mg total) by mouth every 8 (eight) hours as needed for nausea or vomiting. Take 20 minutes prior to taking iron pill.  Dispense: 30 tablet; Refill: 2   Subjective:    Outpatient Medications Prior to  Visit  Medication Sig Dispense Refill   albuterol (VENTOLIN HFA) 108 (90 Base) MCG/ACT inhaler Inhale 2 puffs into the lungs every 6 (six) hours as needed for wheezing or shortness of breath. 8 g 1   Ferrous Sulfate 143 (45 Fe) MG TBCR Take 1 tablet by mouth daily. 30 tablet 5   pantoprazole (PROTONIX) 20 MG tablet Take 1 tablet (20 mg total) by mouth daily. Take 2 hours after or before your iron pill. Take 1 pill twice a day for 2 weeks, then 1 pill every morning. 45 tablet 5   medroxyPROGESTERone (PROVERA) 5 MG tablet Take 1-2 tablets (5-10 mg total) by mouth daily. At the onset of your menstrual cycle. Take 2 pills if flow is not lessened with 1 pill, stop pills as flow lightens after 6-7 days. (Patient not taking: Reported on 10/12/2022) 30 tablet 5   amphetamine-dextroamphetamine (ADDERALL) 20 MG tablet Take 1 tablet (20 mg total) by mouth daily before breakfast. 30 tablet 0   No facility-administered medications prior to visit.   Past Medical History:  Diagnosis Date   ADD (attention deficit disorder)    No past surgical history on file. Allergies  Allergen Reactions   Feraheme [Ferumoxytol] Hives   Iron Sucrose Hives and Nausea And Vomiting      Objective:    Physical Exam Vitals and nursing note reviewed.  Constitutional:      Appearance: Normal appearance.  Cardiovascular:     Rate and Rhythm: Normal rate and regular rhythm.  Pulmonary:     Effort: Pulmonary effort is normal.     Breath sounds: Normal breath sounds.  Musculoskeletal:        General: Normal range of motion.  Skin:    General: Skin is warm and dry.  Neurological:     Mental Status: She is alert.  Psychiatric:        Mood and Affect: Mood normal.        Behavior: Behavior normal.    BP 134/81   Pulse 87   Temp 98 F (36.7 C) (Temporal)   Ht 5\' 5"  (1.651 m)   Wt 187 lb 4 oz (84.9 kg)   SpO2 97%   BMI 31.16 kg/m  Wt Readings from Last 3 Encounters:  10/12/22 187 lb 4 oz (84.9 kg)  07/12/22 173  lb 8 oz (78.7 kg)  11/02/21 155 lb 6 oz (70.5 kg)       Dulce Sellar, NP

## 2022-10-12 NOTE — Telephone Encounter (Signed)
Left patient a vm regarding upcoming appointment  

## 2022-10-12 NOTE — Assessment & Plan Note (Addendum)
Chronic previously taking IR Adderall 20mg  qd, pt thinks this makes her anxiety worse, starting new med today for anxiety advised switching to XR 20mg  Adderall, advised on use & SE sending refill today, f/u in 3 mos, but can let me know via MyChart in 1 month if med is not working for her

## 2022-10-12 NOTE — Assessment & Plan Note (Addendum)
Doing better patient had been seeing a therapist but stopped, stated not helping wants to start medication, discussed options starting Zoloft 25mg  qd, advised on use & SE Encouraged to try therapy again, sometimes it takes time to find the right provider  f/u 1 month - virtual or in-office

## 2022-10-12 NOTE — Addendum Note (Signed)
Addended byDulce Sellar on: 10/12/2022 08:17 PM   Modules accepted: Orders

## 2022-10-17 ENCOUNTER — Telehealth: Payer: Self-pay | Admitting: Family

## 2022-10-17 DIAGNOSIS — F900 Attention-deficit hyperactivity disorder, predominantly inattentive type: Secondary | ICD-10-CM

## 2022-10-17 DIAGNOSIS — T50905A Adverse effect of unspecified drugs, medicaments and biological substances, initial encounter: Secondary | ICD-10-CM

## 2022-10-17 DIAGNOSIS — F419 Anxiety disorder, unspecified: Secondary | ICD-10-CM

## 2022-10-17 DIAGNOSIS — D509 Iron deficiency anemia, unspecified: Secondary | ICD-10-CM

## 2022-10-17 NOTE — Telephone Encounter (Signed)
Patient requests RX for amphetamine-dextroamphetamine (ADDERALL XR) 20 MG 24 hr capsule  be sent to:  [facility] located on [address] in [place] (they have the medication in stock)

## 2022-10-18 MED ORDER — AMPHETAMINE-DEXTROAMPHET ER 20 MG PO CP24
20.0000 mg | ORAL_CAPSULE | ORAL | 0 refills | Status: AC
Start: 2022-10-18 — End: 2022-11-17

## 2022-10-18 MED ORDER — AMPHETAMINE-DEXTROAMPHET ER 20 MG PO CP24
20.0000 mg | ORAL_CAPSULE | ORAL | 0 refills | Status: AC
Start: 2022-11-17 — End: 2022-12-17

## 2022-10-18 MED ORDER — AMPHETAMINE-DEXTROAMPHET ER 20 MG PO CP24
20.0000 mg | ORAL_CAPSULE | ORAL | 0 refills | Status: AC
Start: 2022-12-18 — End: 2023-01-17

## 2022-10-18 NOTE — Telephone Encounter (Signed)
RX sent to wrong Pharmacy. See previous message.

## 2022-10-18 NOTE — Telephone Encounter (Signed)
[facility].  I have put pharmacy in chart.

## 2022-10-24 ENCOUNTER — Inpatient Hospital Stay: Payer: BC Managed Care – PPO | Attending: Hematology and Oncology | Admitting: Hematology and Oncology

## 2022-10-24 ENCOUNTER — Encounter: Payer: Self-pay | Admitting: Hematology and Oncology

## 2022-11-06 ENCOUNTER — Ambulatory Visit: Payer: BC Managed Care – PPO | Admitting: Family

## 2022-11-06 VITALS — BP 139/52 | HR 71 | Temp 97.5°F | Ht 65.0 in | Wt 186.0 lb

## 2022-11-06 DIAGNOSIS — F064 Anxiety disorder due to known physiological condition: Secondary | ICD-10-CM

## 2022-11-06 DIAGNOSIS — D509 Iron deficiency anemia, unspecified: Secondary | ICD-10-CM

## 2022-11-06 DIAGNOSIS — N920 Excessive and frequent menstruation with regular cycle: Secondary | ICD-10-CM | POA: Diagnosis not present

## 2022-11-06 MED ORDER — TRANEXAMIC ACID 650 MG PO TABS
1300.0000 mg | ORAL_TABLET | Freq: Three times a day (TID) | ORAL | 2 refills | Status: AC
Start: 2022-11-06 — End: ?

## 2022-11-06 NOTE — Assessment & Plan Note (Addendum)
started Zoloft 25mg  3 weeks ago.  reports feeling a panic attack after taking with advil advised pt unlikely a reaction, needs to keep taking, discussed serotonin syndrome SE w/adderall is very low, discussed sx to look for continue to advise she try therapy again, sometimes it takes time to find the right provider  pt wanting to take a medical LOA, 12w, will have paperwork faxed over f/u 1 month - virtual or in-office

## 2022-11-06 NOTE — Patient Instructions (Addendum)
It was very nice to see you today!    Call the [facility] office again to find a new therapist to speak with.  [phone]-  Let the [facility]) Hematology folks know that I am your PCP.  I have sent over the new medication Tranexamic acid to take on the first day of your cycle, you take this 3 times per day for 5 days and hopefully this will slow down the amount of bleeding. It does not interact with any of your medications.      PLEASE NOTE:  If you had any lab tests please let us know if you have not heard back within a few days. You may see your results on MyChart before we have a chance to review them but we will give you a call once they are reviewed by [place]. If we ordered any referrals today, please let us know if you have not heard from their office within the next week.

## 2022-11-06 NOTE — Progress Notes (Addendum)
Patient ID: Teresa Middleton, female    DOB: October 23, 1995, 27 y.o.   MRN: [id number]  Chief Complaint  Patient presents with   Anxiety and Depression     Pt states she took zoloft and it Caused a burning sensation in chest after taking advil, The second day of her taking zoloft.    Anemia   Menorrhagia   HPI: Anxiety/Depression: Patient complains of anxiety disorder and depression.   She has the following symptoms: difficulty concentrating, fatigue, palpitations, sweating.  Onset of symptoms was approximately  years ago, She denies current suicidal and homicidal ideation.  Possible organic causes contributing are: endocrine/metabolic. Severe anemia. Risk factors: chronic medical condition. Previous treatment includes no meds and individual therapy.  She complains of the following side effects from the treatment: not effective. **Pt reports taking the Sertraline and her anxiety was worse for a few times, and she was worried about the interaction with her Adderall.  Pt requested a leave of absence from work starting 6/21 - she wants to not work at all for 12 weeks.      11/06/2022    2:53 PM  Depression screen PHQ 2/9  Decreased Interest 1  Down, Depressed, Hopeless 3  PHQ - 2 Score 4  Altered sleeping 2  Tired, decreased energy 2  Change in appetite 1  Feeling bad or failure about yourself  2  Trouble concentrating 1  Moving slowly or fidgety/restless 0  Suicidal thoughts 0  PHQ-9 Score 12  Difficult doing work/chores Somewhat difficult   Anemia:  HX:  Patient presents for f/u. Anemia was found by chronic condition, under care of Hematology, but not seen since last year and required an iron infusion.  It has been present for many years, mainly d/t having heavy menstrual cycles & frequent nose bleeds.  Associated signs & symptoms: reports fatigue & migraines if Hgb goes down closer to 6 or lower range. Reports she has difficulty tolerating oral iron d/t stomach pain/nausea. Seen by  Hematology last summer and given PRBCs which she tolerates but has reactions to iron infusions with hives & nausea & vomiting with stomach cramping. Pt does not like the Hematology provider she was seeing, However, [facility] is not letting her see a different provider, so she has called [facility] in [place] and they can see her in a couple of weeks. Pt states she is tolerating the slow release lower dose Iron I sent in last time, Hgb down to 8 at last check.  Assessment & Plan:  Menorrhagia with regular cycle Assessment & Plan: Chronic, Unstable causing severe anemia pt did not take medroxyprogesterone that I sent in last time, pt not wanting hormones even for short term she has been taking Ibuprofen which helps a little, but her last Hgb down to 8 again, taking low dose slow release iron every other day d/t nausea pt has appt with new HEME at [facility] in next couple of weeks sendig Tranexamic acid, advised it is non-hormone coagulator, safe with her other meds, take on first day of menses x 5d. f/u prn  Orders: -     Tranexamic Acid; Take 2 tablets (1,300 mg total) by mouth 3 (three) times daily. START the first day of your cycle and take for 5 days.  Dispense: 30 tablet; Refill: 2  Chronic iron deficiency anemia Assessment & Plan: Chronic, d/t heavy menses - starting new tx today see HPI for hx - last documented Hgb 8.0 on 10/12/2022 sent lower dose ferrous sulfate, slow  release, pt reports tolerating slightly better, but taking just every other day, Zofran helps a little continue to advise on importance of taking every day, eating iron-rich foods pt has appt with [facility] in 2 weeks f/u prn   Anxiety disorder due to known physiological condition Assessment & Plan: started Zoloft 25mg  3 weeks ago.  reports feeling a panic attack after taking with advil advised pt unlikely a reaction, needs to keep taking, discussed serotonin syndrome SE w/adderall is very low, discussed sx to look  for continue to advise she try therapy again, sometimes it takes time to find the right provider  pt wanting to take a medical LOA, 12w, will have paperwork faxed over f/u 1 month - virtual or in-office    Subjective:    Outpatient Medications Prior to Visit  Medication Sig Dispense Refill   albuterol (VENTOLIN HFA) 108 (90 Base) MCG/ACT inhaler Inhale 2 puffs into the lungs every 6 (six) hours as needed for wheezing or shortness of breath. 8 g 1   [START ON 12/18/2022] amphetamine-dextroamphetamine (ADDERALL XR) 20 MG 24 hr capsule Take 1 capsule (20 mg total) by mouth every morning. 30 capsule 0   [START ON 11/17/2022] amphetamine-dextroamphetamine (ADDERALL XR) 20 MG 24 hr capsule Take 1 capsule (20 mg total) by mouth every morning. 30 capsule 0   amphetamine-dextroamphetamine (ADDERALL XR) 20 MG 24 hr capsule Take 1 capsule (20 mg total) by mouth every morning. 30 capsule 0   Ferrous Sulfate 143 (45 Fe) MG TBCR Take 1 tablet by mouth daily. 30 tablet 5   ondansetron (ZOFRAN) 4 MG tablet Take 1 tablet (4 mg total) by mouth every 8 (eight) hours as needed for nausea or vomiting. Take 20 minutes prior to taking iron pill. 30 tablet 2   sertraline (ZOLOFT) 25 MG tablet Take 1 tablet (25 mg total) by mouth daily. 30 tablet 2   medroxyPROGESTERone (PROVERA) 5 MG tablet Take 1-2 tablets (5-10 mg total) by mouth daily. At the onset of your menstrual cycle. Take 2 pills if flow is not lessened with 1 pill, stop pills as flow lightens after 6-7 days. 30 tablet 5   pantoprazole (PROTONIX) 20 MG tablet Take 1 tablet (20 mg total) by mouth daily. Take 2 hours after or before your iron pill. Take 1 pill twice a day for 2 weeks, then 1 pill every morning. 45 tablet 5   No facility-administered medications prior to visit.   Past Medical History:  Diagnosis Date   Acute on chronic anemia 10/13/2021   ADD (attention deficit disorder)    Symptomatic anemia 06/13/2020   No past surgical history on  file. Allergies  Allergen Reactions   Feraheme [Ferumoxytol] Hives   Iron Sucrose Hives and Nausea And Vomiting      Objective:    Physical Exam Vitals and nursing note reviewed.  Constitutional:      Appearance: Normal appearance.  Cardiovascular:     Rate and Rhythm: Normal rate and regular rhythm.  Pulmonary:     Effort: Pulmonary effort is normal.     Breath sounds: Normal breath sounds.  Musculoskeletal:        General: Normal range of motion.  Skin:    General: Skin is warm and dry.  Neurological:     Mental Status: She is alert.  Psychiatric:        Mood and Affect: Mood normal.        Behavior: Behavior normal.    BP (!) 139/52  Pulse 71   Temp (!) 97.5 F (36.4 C) (Temporal)   Ht 5\' 5"  (1.651 m)   Wt 186 lb (84.4 kg)   SpO2 94%   BMI 30.95 kg/m  Wt Readings from Last 3 Encounters:  11/06/22 186 lb (84.4 kg)  10/12/22 187 lb 4 oz (84.9 kg)  07/12/22 173 lb 8 oz (78.7 kg)     *Extra time ( ) spent with patient today which consisted of chart review, discussing diagnoses, work up, treatment,  answering questions, and documentation.   Dulce Sellar, NP

## 2022-11-06 NOTE — Assessment & Plan Note (Signed)
Chronic see HPI for hx - last documented Hgb 10.1 in Nov 2023 sent new H[facility]E referral, however, they will not allow her to see different provider (most likely d/t non-compliance) sent lower dose ferrous sulfate, slow release, pt reports tolerating slightly better, but still having nausea, sending Zofran today to take prior to iron pill continue to advise on iron-rich foods will send new referral to outside hematology clinic f/u prn 

## 2022-11-07 ENCOUNTER — Encounter: Payer: Self-pay | Admitting: Family

## 2022-11-07 NOTE — Assessment & Plan Note (Signed)
Chronic, Unstable causing severe anemia pt did not take medroxyprogesterone that I sent in last time, pt not wanting hormones even for short term she has been taking Ibuprofen which helps a little, but her last Hgb down to 8 again, taking low dose slow release iron every other day d/t nausea pt has appt with new H[facility]E at [facility] in next couple of weeks sendig Tranexamic acid, advised it is non-hormone coagulator, safe with her other meds, take on first day of menses x 5d. f/u prn

## 2022-11-10 ENCOUNTER — Telehealth: Payer: Self-pay | Admitting: Family

## 2022-11-10 NOTE — Telephone Encounter (Signed)
Patient requests to be called for status of FMLA forms Patient's employer faxed to PCP 11/08/22

## 2022-11-10 NOTE — Telephone Encounter (Signed)
Type of form received: FMLA  Additional comments:  NA  Received by: Leodis Rains should be Faxed to: [phone]  Form should be mailed to:    Is patient requesting call for pickup: YES - Please fax and call pt to pick up forms.   Form placed:  Folder  Attach charge sheet.  YES  Individual made aware of 3-5 business day turn around (Y/N)? YES

## 2022-11-13 NOTE — Telephone Encounter (Signed)
Received

## 2022-11-14 NOTE — Telephone Encounter (Signed)
Patient returned call. Requests to be called. 

## 2022-11-15 DIAGNOSIS — Z0279 Encounter for issue of other medical certificate: Secondary | ICD-10-CM

## 2022-11-15 NOTE — Telephone Encounter (Signed)
Patient returned call. States available for callback, may call back herself later.

## 2022-11-15 NOTE — Telephone Encounter (Signed)
Pt came into office to go over [facility] paperwork.

## 2022-11-22 DIAGNOSIS — R112 Nausea with vomiting, unspecified: Secondary | ICD-10-CM | POA: Diagnosis not present

## 2022-11-22 DIAGNOSIS — R1031 Right lower quadrant pain: Secondary | ICD-10-CM | POA: Diagnosis not present

## 2022-11-22 DIAGNOSIS — Z5321 Procedure and treatment not carried out due to patient leaving prior to being seen by health care provider: Secondary | ICD-10-CM | POA: Diagnosis not present

## 2022-11-22 DIAGNOSIS — Z20822 Contact with and (suspected) exposure to covid-19: Secondary | ICD-10-CM | POA: Diagnosis not present

## 2022-11-22 DIAGNOSIS — R509 Fever, unspecified: Secondary | ICD-10-CM | POA: Diagnosis not present

## 2022-11-22 DIAGNOSIS — A084 Viral intestinal infection, unspecified: Secondary | ICD-10-CM | POA: Diagnosis not present

## 2022-11-22 LAB — LAB REPORT - SCANNED: eGFR: 90

## 2022-11-22 IMAGING — CT CT ABD-PELV W/O CM
2 of 4 series · 16 of 46 positions shown, 18 images · non-contrast
Comparison: None.

CLINICAL DATA: Onset nausea, vomiting and fever yesterday. Back
pain.

EXAM:
CT ABDOMEN AND PELVIS WITHOUT CONTRAST
TECHNIQUE: Multidetector CT imaging of the abdomen and pelvis was performed
following the standard protocol without IV contrast.

[Series 2: axial st · axial · 0.67mm/px · z∈[+1161,+1551]mm · 13 of 88 slices shown, 15 images]
[im 5/88  soft-tissue]
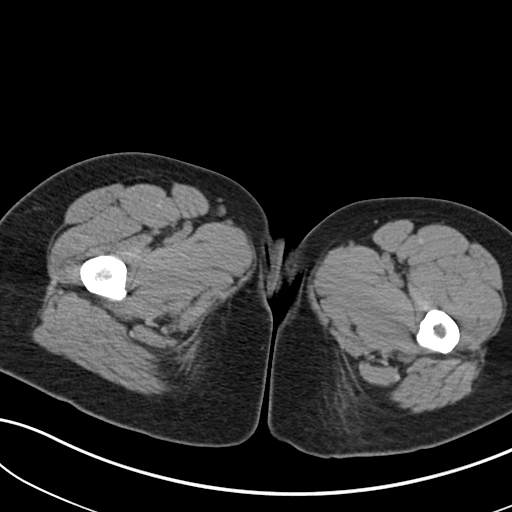
[im 5/88  bone]
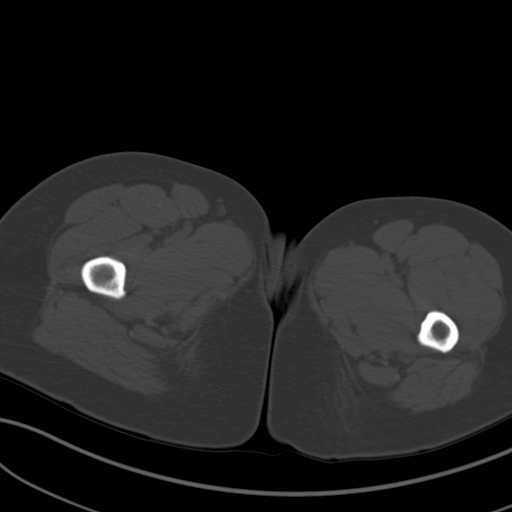
[im 10/88  soft-tissue]
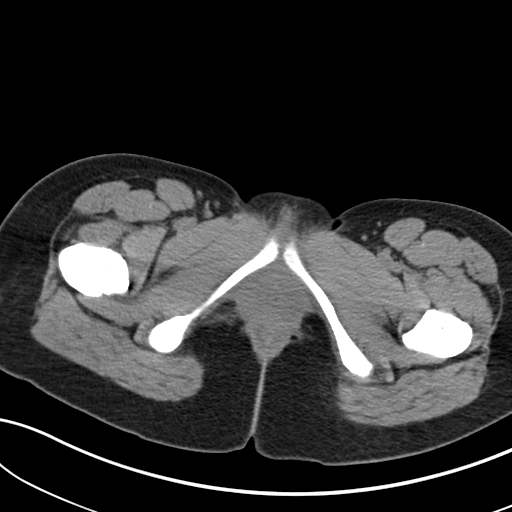
[im 20/88  soft-tissue]
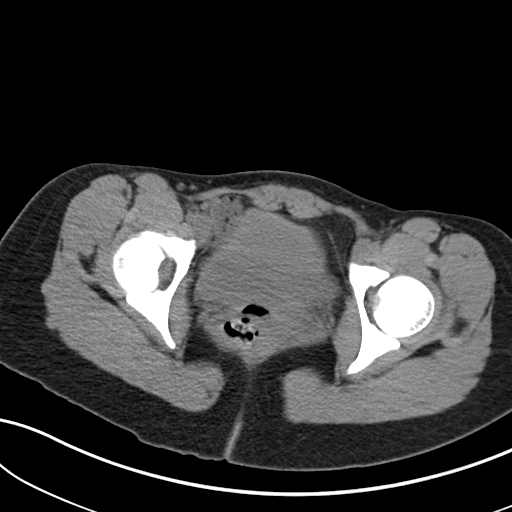
[im 25/88  soft-tissue]
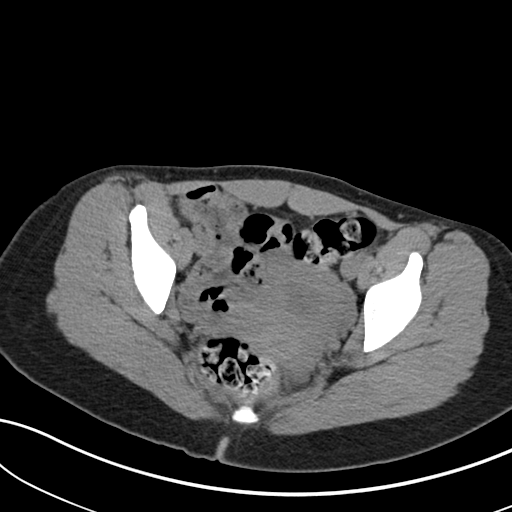
[im 30/88  soft-tissue]
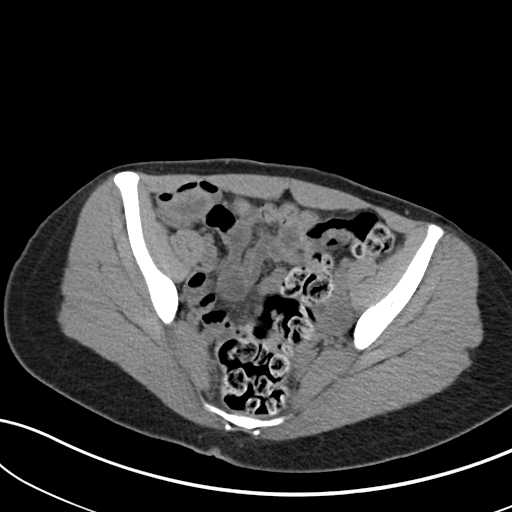
[im 39/88  soft-tissue]
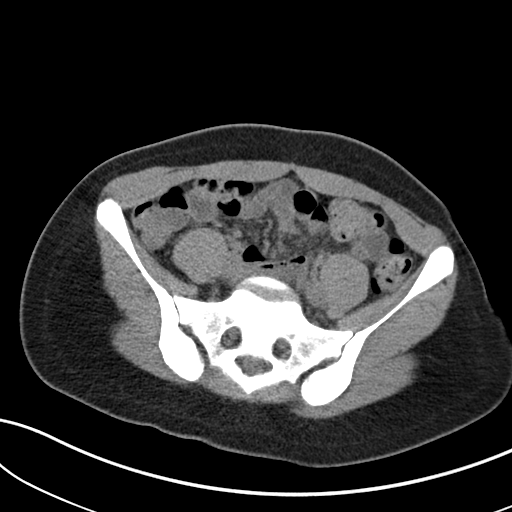
[im 44/88  soft-tissue]
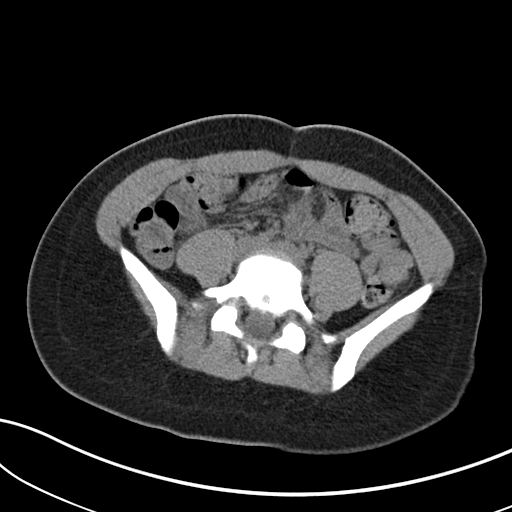
[im 49/88  soft-tissue]
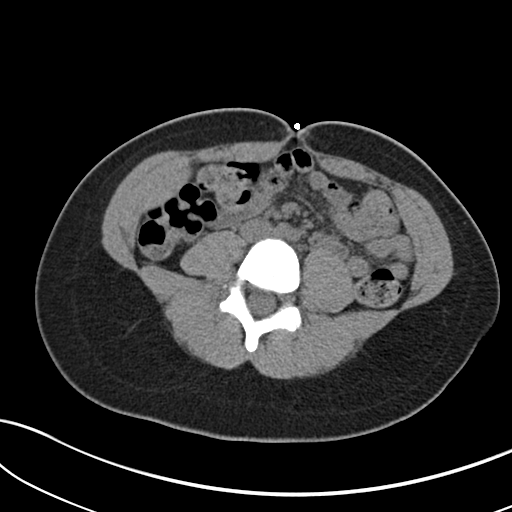
[im 59/88  soft-tissue]
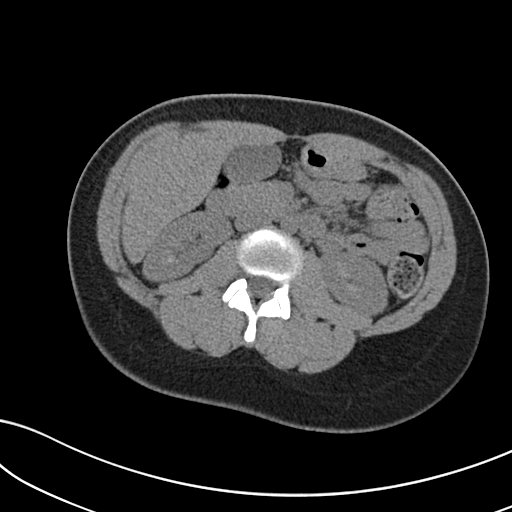
[im 59/88  bone]
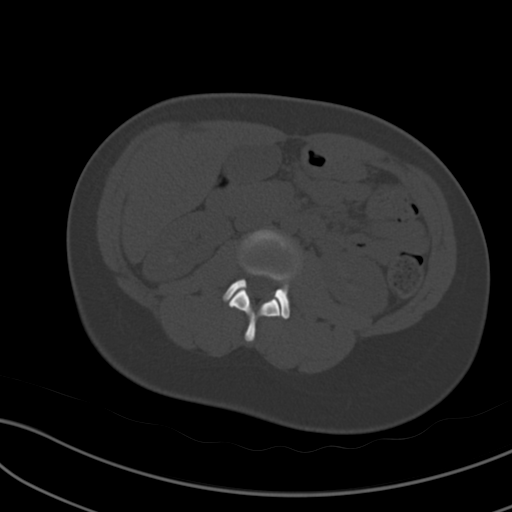
[im 63/88  soft-tissue]
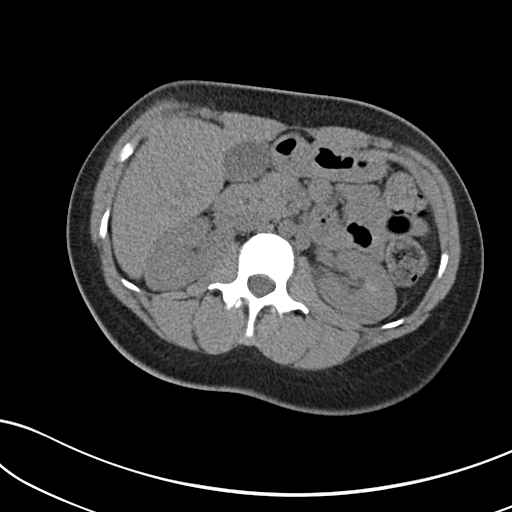
[im 68/88  soft-tissue]
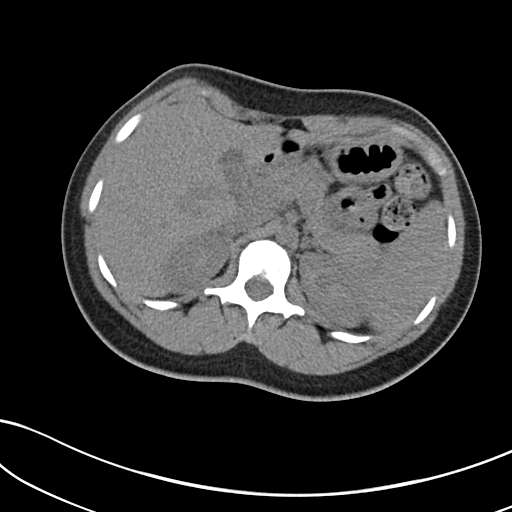
[im 78/88  soft-tissue]
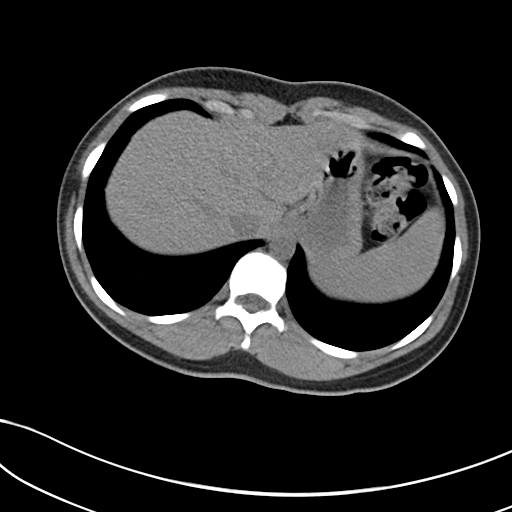
[im 83/88  soft-tissue]
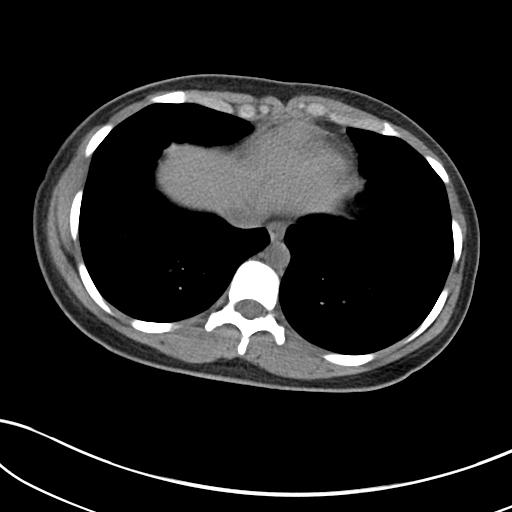

[Series 5: coronal st · coronal · 0.71mm/px · 3 of 137 slices shown]
[im 46/137  soft-tissue]
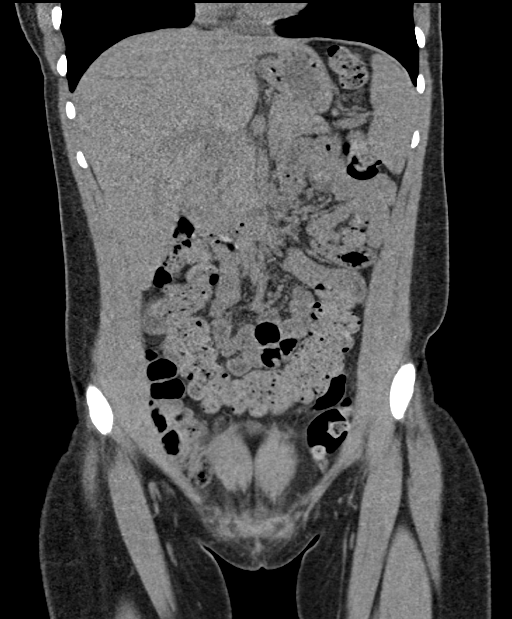
[im 61/137  soft-tissue]
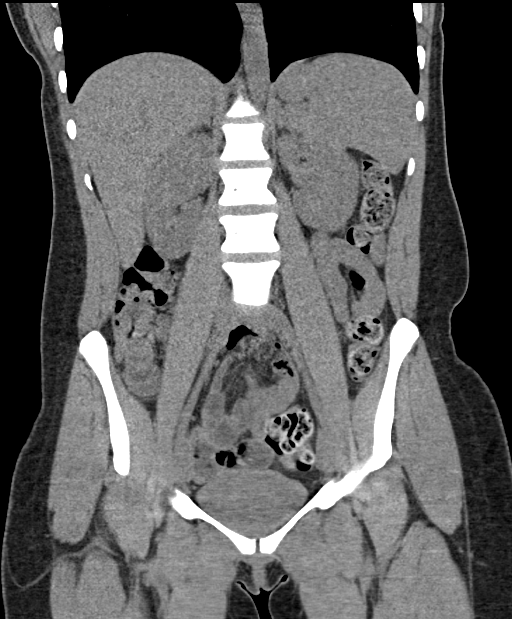
[im 76/137  soft-tissue]
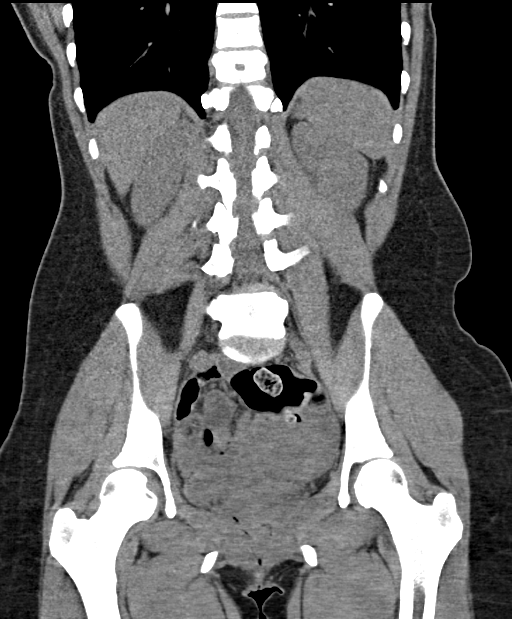

[16 of 46 positions shown; findings below may reference images not displayed]

FINDINGS: Lower chest: Lung bases are clear. No pleural or pericardial
effusion.

Hepatobiliary: No focal liver abnormality is seen. No gallstones,
gallbladder wall thickening, or biliary dilatation.

Pancreas: Unremarkable. No pancreatic ductal dilatation or
surrounding inflammatory changes.

Spleen: Normal in size without focal abnormality.

Adrenals/Urinary Tract: Adrenal glands are unremarkable. Kidneys are
normal, without renal calculi, focal lesion, or hydronephrosis.
Bladder is unremarkable.

Stomach/Bowel: Stomach is within normal limits. Appendix appears
normal. No evidence of bowel wall thickening, distention, or
inflammatory changes.

Vascular/Lymphatic: No significant vascular findings are present. No
enlarged abdominal or pelvic lymph nodes.

Reproductive: Uterus and bilateral adnexa are unremarkable.

Other: None.

Musculoskeletal: Negative.
IMPRESSION: Negative CT abdomen and pelvis. No finding to explain the patient's
symptoms.

## 2022-12-01 ENCOUNTER — Ambulatory Visit: Payer: BC Managed Care – PPO | Admitting: Family

## 2022-12-12 ENCOUNTER — Other Ambulatory Visit: Payer: Self-pay

## 2022-12-12 ENCOUNTER — Telehealth: Payer: Self-pay | Admitting: Family

## 2022-12-12 DIAGNOSIS — J45990 Exercise induced bronchospasm: Secondary | ICD-10-CM

## 2022-12-12 MED ORDER — ALBUTEROL SULFATE HFA 108 (90 BASE) MCG/ACT IN AERS: 2.0000 | INHALATION_SPRAY | Freq: Four times a day (QID) | RESPIRATORY_TRACT | 1 refills | Status: AC | PRN

## 2022-12-12 NOTE — Telephone Encounter (Signed)
Prescription Request  12/12/2022  LOV: 11/06/2022  What is the name of the medication or equipment?  albuterol (VENTOLIN HFA) 108 (90 Base) MCG/ACT inhaler  Have you contacted your pharmacy to request a refill? Yes   Which pharmacy would you like this sent to?   [id number] - [place], [place] - [address] AT [facility] Phone: [phone]  Fax: [phone]      Patient notified that their request is being sent to the clinical staff for review and that they should receive a response within 2 business days.   Please advise at Mobile [phone] (mobile)

## 2022-12-12 NOTE — Telephone Encounter (Signed)
Rx sent 

## 2022-12-13 DIAGNOSIS — R062 Wheezing: Secondary | ICD-10-CM | POA: Diagnosis not present

## 2022-12-13 DIAGNOSIS — M791 Myalgia, unspecified site: Secondary | ICD-10-CM | POA: Diagnosis not present

## 2022-12-13 DIAGNOSIS — U071 COVID-19: Secondary | ICD-10-CM | POA: Diagnosis not present

## 2022-12-13 DIAGNOSIS — R509 Fever, unspecified: Secondary | ICD-10-CM | POA: Diagnosis not present

## 2022-12-13 DIAGNOSIS — R059 Cough, unspecified: Secondary | ICD-10-CM | POA: Diagnosis not present

## 2022-12-13 DIAGNOSIS — R0602 Shortness of breath: Secondary | ICD-10-CM | POA: Diagnosis not present

## 2022-12-13 LAB — LAB REPORT - SCANNED: EGFR: 75

## 2022-12-20 ENCOUNTER — Other Ambulatory Visit: Payer: Self-pay | Admitting: Family

## 2022-12-20 DIAGNOSIS — F419 Anxiety disorder, unspecified: Secondary | ICD-10-CM

## 2023-05-25 LAB — LAB REPORT - SCANNED: EGFR: 90

## 2023-10-10 LAB — LAB REPORT - SCANNED
